# Patient Record
Sex: Female | Born: 1975 | Race: White | Hispanic: No | Marital: Married | Smoking: Former smoker
Health system: Southern US, Community
[De-identification: ages and names within clinical notes are randomized; demographics above are authoritative.]

## PROBLEM LIST (undated history)

## (undated) DIAGNOSIS — D229 Melanocytic nevi, unspecified: Secondary | ICD-10-CM

## (undated) DIAGNOSIS — E079 Disorder of thyroid, unspecified: Secondary | ICD-10-CM

## (undated) DIAGNOSIS — E039 Hypothyroidism, unspecified: Secondary | ICD-10-CM

## (undated) DIAGNOSIS — N83209 Unspecified ovarian cyst, unspecified side: Secondary | ICD-10-CM

## (undated) HISTORY — PX: OTHER SURGICAL HISTORY: SHX169

---
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
## 2001-06-14 ENCOUNTER — Encounter: Payer: Self-pay | Admitting: Family Medicine

## 2001-06-14 ENCOUNTER — Ambulatory Visit (HOSPITAL_COMMUNITY): Admission: RE | Admit: 2001-06-14 | Discharge: 2001-06-14 | Payer: Self-pay

## 2001-06-14 ENCOUNTER — Other Ambulatory Visit: Admission: RE | Admit: 2001-06-14 | Discharge: 2001-06-14 | Payer: Self-pay | Admitting: Family Medicine

## 2014-12-24 ENCOUNTER — Other Ambulatory Visit (HOSPITAL_COMMUNITY)
Admission: RE | Admit: 2014-12-24 | Discharge: 2014-12-24 | Disposition: A | Discharge status: Home / Self Care | Payer: BC Managed Care – PPO | Source: Ambulatory Visit | Attending: Obstetrics & Gynecology | Admitting: Obstetrics & Gynecology

## 2014-12-24 ENCOUNTER — Encounter: Payer: Self-pay | Admitting: Obstetrics & Gynecology

## 2014-12-24 ENCOUNTER — Ambulatory Visit (INDEPENDENT_AMBULATORY_CARE_PROVIDER_SITE_OTHER): Payer: BC Managed Care – PPO | Admitting: Obstetrics & Gynecology

## 2014-12-24 VITALS — BP 120/80 | HR 76 | Ht 63.2 in | Wt 194.0 lb

## 2014-12-24 DIAGNOSIS — Z1151 Encounter for screening for human papillomavirus (HPV): Secondary | ICD-10-CM | POA: Diagnosis present

## 2014-12-24 DIAGNOSIS — Z01419 Encounter for gynecological examination (general) (routine) without abnormal findings: Secondary | ICD-10-CM | POA: Diagnosis not present

## 2014-12-24 NOTE — Progress Notes (Signed)
Patient ID: Ashley [name], female   DOB: [birth year], 39 y.o.   MRN: [id number] Subjective:     Ashley [name] is a 39 y.o. female here for a routine exam.  Patient's last menstrual period was 12/06/2014. No obstetric history on file. Birth Control Method:  None, husband had a vasectomy Menstrual Calendar(currently): regular  Current complaints: none.   Current acute medical issues:  none   Recent Gynecologic History Patient's last menstrual period was 12/06/2014. Last Pap: 10 years ago,  normal Last mammogram: ,    History reviewed. No pertinent past medical history.  History reviewed. No pertinent past surgical history.  OB History    No data available      History   Social History  . Marital Status: Single    Spouse Name: N/A  . Number of Children: N/A  . Years of Education: N/A   Social History Main Topics  . Smoking status: Never Smoker   . Smokeless tobacco: Not on file  . Alcohol Use: Not on file  . Drug Use: Not on file  . Sexual Activity: Yes   Other Topics Concern  . None   Social History Narrative  . None    Family History  Problem Relation Age of Onset  . Diabetes Maternal Grandfather   . Heart disease Maternal Grandfather   . Hypertension Mother   . Diabetes Maternal Uncle     No current outpatient prescriptions on file.  Review of Systems  Review of Systems  Constitutional: Negative for fever, chills, weight loss, malaise/fatigue and diaphoresis.  HENT: Negative for hearing loss, ear pain, nosebleeds, congestion, sore throat, neck pain, tinnitus and ear discharge.   Eyes: Negative for blurred vision, double vision, photophobia, pain, discharge and redness.  Respiratory: Negative for cough, hemoptysis, sputum production, shortness of breath, wheezing and stridor.   Cardiovascular: Negative for chest pain, palpitations, orthopnea, claudication, leg swelling and PND.  Gastrointestinal: negative for abdominal pain. Negative for heartburn, nausea,  vomiting, diarrhea, constipation, blood in stool and melena.  Genitourinary: Negative for dysuria, urgency, frequency, hematuria and flank pain.  Musculoskeletal: Negative for myalgias, back pain, joint pain and falls.  Skin: Negative for itching and rash.  Neurological: Negative for dizziness, tingling, tremors, sensory change, speech change, focal weakness, seizures, loss of consciousness, weakness and headaches.  Endo/Heme/Allergies: Negative for environmental allergies and polydipsia. Does not bruise/bleed easily.  Psychiatric/Behavioral: Negative for depression, suicidal ideas, hallucinations, memory loss and substance abuse. The patient is not nervous/anxious and does not have insomnia.        Objective:  Blood pressure 120/80, pulse 76, height 5' 3.2" (1.605 m), weight 194 lb (87.998 kg), last menstrual period 12/06/2014.   Physical Exam  Vitals reviewed. Constitutional: She is oriented to person, place, and time. She appears well-developed and well-nourished.  HENT:  Head: Normocephalic and atraumatic.        Right Ear: External ear normal.  Left Ear: External ear normal.  Nose: Nose normal.  Mouth/Throat: Oropharynx is clear and moist.  Eyes: Conjunctivae and EOM are normal. Pupils are equal, round, and reactive to light. Right eye exhibits no discharge. Left eye exhibits no discharge. No scleral icterus.  Neck: Normal range of motion. Neck supple. No tracheal deviation present. No thyromegaly present.  Cardiovascular: Normal rate, regular rhythm, normal heart sounds and intact distal pulses.  Exam reveals no gallop and no friction rub.   No murmur heard. Respiratory: Effort normal and breath sounds normal. No respiratory distress. She has no  wheezes. She has no rales. She exhibits no tenderness.  GI: Soft. Bowel sounds are normal. She exhibits no distension and no mass. There is no tenderness. There is no rebound and no guarding.  Genitourinary:  Breasts no masses skin changes  or nipple changes bilaterally      Vulva is normal without lesions Vagina is pink moist without discharge Cervix normal in appearance and pap is done Uterus is normal size shape and contour Adnexa is negative with normal sized ovaries  {Rectal    hemoccult negative, normal tone, no masses  Musculoskeletal: Normal range of motion. She exhibits no edema and no tenderness.  Neurological: She is alert and oriented to person, place, and time. She has normal reflexes. She displays normal reflexes. No cranial nerve deficit. She exhibits normal muscle tone. Coordination normal.  Skin: Skin is warm and dry. No rash noted. No erythema. No pallor.  Psychiatric: She has a normal mood and affect. Her behavior is normal. Judgment and thought content normal.       Assessment:    Healthy female exam.    Plan:    Contraception: vasectomy. Follow up in: 1 year.

## 2014-12-24 NOTE — Addendum Note (Signed)
Addended by: Doyne Keel on: 12/24/2014 03:52 PM   Modules accepted: Orders

## 2014-12-28 LAB — CYTOLOGY - PAP

## 2015-02-05 ENCOUNTER — Telehealth: Payer: Self-pay | Admitting: Family Medicine

## 2015-02-05 NOTE — Telephone Encounter (Signed)
Advised patient that our first new patient appointment isn't until the beginning of August.  She needs to have her blood pressure rechecked and a form filled out for work since it was high on her pre-employment physical. She is going to see if someone else can get her in sooner.

## 2016-05-15 ENCOUNTER — Emergency Department (HOSPITAL_COMMUNITY): Payer: BC Managed Care – PPO

## 2016-05-15 ENCOUNTER — Emergency Department (HOSPITAL_COMMUNITY)
Admission: EM | Admit: 2016-05-15 | Discharge: 2016-05-15 | Disposition: A | Discharge status: Home / Self Care | Payer: BC Managed Care – PPO | Attending: Dermatology | Admitting: Dermatology

## 2016-05-15 ENCOUNTER — Encounter (HOSPITAL_COMMUNITY): Payer: Self-pay | Admitting: Emergency Medicine

## 2016-05-15 DIAGNOSIS — Z5321 Procedure and treatment not carried out due to patient leaving prior to being seen by health care provider: Secondary | ICD-10-CM | POA: Insufficient documentation

## 2016-05-15 DIAGNOSIS — R079 Chest pain, unspecified: Secondary | ICD-10-CM | POA: Insufficient documentation

## 2016-05-15 HISTORY — DX: Disorder of thyroid, unspecified: E07.9

## 2016-05-15 NOTE — ED Notes (Signed)
EKG given to Dr. McManus.  

## 2016-05-15 NOTE — ED Triage Notes (Signed)
Pt reports pain in center of chest radiating to abdomen x1 hour.  Pt also having sob, lightheadedness, back pain, diaphoresis.  Pt alert and oriented at this time.

## 2016-06-19 ENCOUNTER — Emergency Department (HOSPITAL_COMMUNITY)
Admission: EM | Admit: 2016-06-19 | Discharge: 2016-06-19 | Disposition: A | Discharge status: Home / Self Care | Payer: BC Managed Care – PPO | Attending: Emergency Medicine | Admitting: Emergency Medicine

## 2016-06-19 ENCOUNTER — Encounter (HOSPITAL_COMMUNITY): Payer: Self-pay | Admitting: Emergency Medicine

## 2016-06-19 DIAGNOSIS — K625 Hemorrhage of anus and rectum: Secondary | ICD-10-CM

## 2016-06-19 DIAGNOSIS — Z79899 Other long term (current) drug therapy: Secondary | ICD-10-CM | POA: Insufficient documentation

## 2016-06-19 DIAGNOSIS — K649 Unspecified hemorrhoids: Secondary | ICD-10-CM | POA: Diagnosis not present

## 2016-06-19 LAB — COMPREHENSIVE METABOLIC PANEL
ALK PHOS: 56 U/L (ref 38–126)
ALT: 15 U/L (ref 14–54)
AST: 18 U/L (ref 15–41)
Albumin: 4.3 g/dL (ref 3.5–5.0)
Anion gap: 7 (ref 5–15)
BILIRUBIN TOTAL: 0.7 mg/dL (ref 0.3–1.2)
BUN: 11 mg/dL (ref 6–20)
CALCIUM: 9.3 mg/dL (ref 8.9–10.3)
CO2: 25 mmol/L (ref 22–32)
CREATININE: 0.75 mg/dL (ref 0.44–1.00)
Chloride: 108 mmol/L (ref 101–111)
Glucose, Bld: 102 mg/dL — ABNORMAL HIGH (ref 65–99)
Potassium: 3.9 mmol/L (ref 3.5–5.1)
Sodium: 140 mmol/L (ref 135–145)
TOTAL PROTEIN: 7.3 g/dL (ref 6.5–8.1)

## 2016-06-19 LAB — CBC
HCT: 36 % (ref 36.0–46.0)
Hemoglobin: 12.9 g/dL (ref 12.0–15.0)
MCH: 31.8 pg (ref 26.0–34.0)
MCHC: 35.8 g/dL (ref 30.0–36.0)
MCV: 88.7 fL (ref 78.0–100.0)
PLATELETS: 224 10*3/uL (ref 150–400)
RBC: 4.06 MIL/uL (ref 3.87–5.11)
RDW: 12.2 % (ref 11.5–15.5)
WBC: 5.2 10*3/uL (ref 4.0–10.5)

## 2016-06-19 LAB — POC OCCULT BLOOD, ED: FECAL OCCULT BLD: POSITIVE — AB

## 2016-06-19 MED ORDER — PRAMOXINE HCL 1 % RE FOAM: 1.0000 "application " | Freq: Three times a day (TID) | RECTAL | 0 refills | Status: DC | PRN

## 2016-06-19 MED ORDER — DOCUSATE SODIUM 100 MG PO CAPS: 100.0000 mg | ORAL_CAPSULE | Freq: Two times a day (BID) | ORAL | 0 refills | Status: DC

## 2016-06-19 NOTE — Discharge Instructions (Signed)
Follow-up with a primary care doctor later this week to be rechecked,  use the stool softeners and  the rectal cream, return to the emergency room for worsening symptoms, lightheadedness, fevers

## 2016-06-19 NOTE — ED Provider Notes (Signed)
[facility] DEPT Provider Note   CSN: [id number] Arrival date & time: 06/19/16  1201  By signing my name below, I, [name], attest that this documentation has been prepared under the direction and in the presence of Dorie Rank, MD . Electronically Signed: [name], Scribe. 06/19/2016. 12:46 PM.  History   Chief Complaint Chief Complaint  Patient presents with  . Rectal Bleeding   The history is provided by the patient. No language interpreter was used.   HPI Comments: Ashley [name] is a 40 y.o. female who presents to the Emergency Department for an evaluation of rectal bleeding that began at 11:00 AM this morning. Pt reports she was at work this morning when she used the restroom, passed some stool and saw a large amount of bright red blood after wiping. She denies dizziness, lightheadedness, abdominal pain, rectal pain, fever, chills and hx of hemorrhoids or rectal bleeding.   Past Medical History:  Diagnosis Date  . Thyroid disease    There are no active problems to display for this patient.  History reviewed. No pertinent surgical history.  OB History    Gravida Para Term Preterm AB Living             0   SAB TAB Ectopic Multiple Live Births                 Home Medications    Prior to Admission medications   Medication Sig Start Date End Date Taking? Authorizing Provider  acetaminophen (TYLENOL) 500 MG tablet Take 1,000 mg by mouth every 6 (six) hours as needed.   Yes Historical Provider, MD  levothyroxine (SYNTHROID, LEVOTHROID) 25 MCG tablet Take 1 tablet by mouth every evening. 06/02/16  Yes Historical Provider, MD  docusate sodium (COLACE) 100 MG capsule Take 1 capsule (100 mg total) by mouth every 12 (twelve) hours. 06/19/16   Dorie Rank, MD  pramoxine (PROCTOFOAM) 1 % foam Place 1 application rectally 3 (three) times daily as needed for itching. 06/19/16   Dorie Rank, MD    Family History Family History  Problem Relation Age of Onset  . Diabetes Maternal  Grandfather   . Heart disease Maternal Grandfather   . Hypertension Mother   . Diabetes Maternal Uncle     Social History Social History  Substance Use Topics  . Smoking status: Never Smoker  . Smokeless tobacco: Never Used  . Alcohol use No   Allergies   Patient has no known allergies.  Review of Systems Review of Systems  Constitutional: Negative for chills and fever.  Gastrointestinal: Positive for anal bleeding. Negative for abdominal pain and rectal pain.  Neurological: Negative for dizziness and light-headedness.   Physical Exam Updated Vital Signs BP 155/83 (BP Location: Left Arm)   Pulse 99   Temp 98.2 F (36.8 C) (Oral)   Resp 20   Ht 5\' 3"  (1.6 m)   Wt 190 lb (86.2 kg)   LMP 06/16/2016   SpO2 100%   BMI 33.66 kg/m   Physical Exam  Constitutional: She appears well-developed and well-nourished. No distress.  HENT:  Head: Normocephalic and atraumatic.  Right Ear: External ear normal.  Left Ear: External ear normal.  Eyes: Conjunctivae are normal. Right eye exhibits no discharge. Left eye exhibits no discharge. No scleral icterus.  Neck: Neck supple. No tracheal deviation present.  Cardiovascular: Normal rate, regular rhythm and intact distal pulses.   Pulmonary/Chest: Effort normal and breath sounds normal. No stridor. No respiratory distress. She has no  wheezes. She has no rales.  Abdominal: Soft. Bowel sounds are normal. She exhibits no distension. There is no tenderness. There is no rebound and no guarding.  Genitourinary: Rectal exam shows external hemorrhoid, tenderness and guaiac positive stool.  Genitourinary Comments: Bright red blood on rectal exam, no internal masses palpated.  Musculoskeletal: She exhibits no edema or tenderness.  Neurological: She is alert. She has normal strength. No cranial nerve deficit (no facial droop, extraocular movements intact, no slurred speech) or sensory deficit. She exhibits normal muscle tone. She displays no seizure  activity. Coordination normal.  Skin: Skin is warm and dry. No rash noted.  Psychiatric: She has a normal mood and affect.  Nursing note and vitals reviewed.  ED Treatments / Results  Labs (all labs ordered are listed, but only abnormal results are displayed) Labs Reviewed  COMPREHENSIVE METABOLIC PANEL - Abnormal; Notable for the following:       Result Value   Glucose, Bld 102 (*)    All other components within normal limits  POC OCCULT BLOOD, ED - Abnormal; Notable for the following:    Fecal Occult Bld POSITIVE (*)    All other components within normal limits  CBC   Procedures Procedures (including critical care time)    Oxygen Saturation is 100% on RA, normal by my interpretation.    COORDINATION OF CARE:  12:43 PM Discussed treatment plan with pt at bedside and pt agreed to plan.  Initial Impression / Assessment and Plan / ED Course  I have reviewed the triage vital signs and the nursing notes.  Pertinent labs & imaging results that were available during my care of the patient were reviewed by me and considered in my medical decision making (see chart for details).  Clinical Course     The patient's laboratory tests are reassuring. On exam she does have a small tender hemorrhoid. I suspect this is the source of her rectal bleeding. Plan on discharge home with stool softeners and hemorrhoid cream. Follow up with primary doctor. Warning signs and precautions discussed.  I personally performed the services described in this documentation, which was scribed in my presence. The recorded information has been reviewed and is accurate.   Final Clinical Impressions(s) / ED Diagnoses   Final diagnoses:  Hemorrhoids, unspecified hemorrhoid type  Rectal bleeding    New Prescriptions New Prescriptions   DOCUSATE SODIUM (COLACE) 100 MG CAPSULE    Take 1 capsule (100 mg total) by mouth every 12 (twelve) hours.   PRAMOXINE (PROCTOFOAM) 1 % FOAM    Place 1 application rectally  3 (three) times daily as needed for itching.     Dorie Rank, MD 06/19/16 [phone]

## 2016-06-19 NOTE — ED Triage Notes (Signed)
Pt reports passing bright red blood this am. No prior hx of same. Pt denies hemorrhoids.

## 2017-04-10 LAB — OB RESULTS CONSOLE GC/CHLAMYDIA
CHLAMYDIA, DNA PROBE: NEGATIVE
GC PROBE AMP, GENITAL: NEGATIVE

## 2017-04-10 LAB — OB RESULTS CONSOLE ABO/RH: RH Type: POSITIVE

## 2017-04-10 LAB — OB RESULTS CONSOLE ANTIBODY SCREEN: ANTIBODY SCREEN: NEGATIVE

## 2017-04-10 LAB — OB RESULTS CONSOLE RUBELLA ANTIBODY, IGM: RUBELLA: IMMUNE

## 2017-04-10 LAB — OB RESULTS CONSOLE HIV ANTIBODY (ROUTINE TESTING): HIV: NONREACTIVE

## 2017-04-10 LAB — OB RESULTS CONSOLE RPR: RPR: NONREACTIVE

## 2017-04-10 LAB — OB RESULTS CONSOLE HEPATITIS B SURFACE ANTIGEN: HEP B S AG: NEGATIVE

## 2017-08-07 NOTE — L&D Delivery Note (Addendum)
Patient was C/C/+2 and pushed for approx 3hr 25minutes with epidural.    NSVD  female infant OP presentation with some asynclitism, Apgars per nursery staff , weight pending.   The patient had a 2nd degree R mediolateral episiotomy repaired with 2-0 vicryl Fundus was firm. EBL was approx 500, 1024mcg cytotec placed rectally and once dose of hemabate 0.25mg  IM, clots manually evacuated from uterus, bleeding reduced significantly Placenta was delivered intact. Vagina was clear.  Delayed cord clamping done for 30-60 seconds while warming baby. Baby was vigorous and doing skin to skin with mother.  Ashley [name]

## 2017-08-08 ENCOUNTER — Encounter: Payer: BC Managed Care – PPO | Attending: Obstetrics and Gynecology | Admitting: Registered"

## 2017-08-08 ENCOUNTER — Encounter: Payer: Self-pay | Admitting: Registered"

## 2017-08-08 DIAGNOSIS — O24419 Gestational diabetes mellitus in pregnancy, unspecified control: Secondary | ICD-10-CM | POA: Diagnosis not present

## 2017-08-08 DIAGNOSIS — Z713 Dietary counseling and surveillance: Secondary | ICD-10-CM | POA: Insufficient documentation

## 2017-08-08 DIAGNOSIS — R7309 Other abnormal glucose: Secondary | ICD-10-CM

## 2017-08-08 NOTE — Progress Notes (Signed)
Patient was seen on 08/08/17 for Gestational Diabetes self-management class at the [facility]. The following learning objectives were met by the patient during this course:   States the definition of Gestational Diabetes  States why dietary management is important in controlling blood glucose  Describes the effects each nutrient has on blood glucose levels  Demonstrates ability to create a balanced meal plan  Demonstrates carbohydrate counting   States when to check blood glucose levels  Demonstrates proper blood glucose monitoring techniques  States the effect of stress and exercise on blood glucose levels  States the importance of limiting caffeine and abstaining from alcohol and smoking  Blood glucose monitor given: none Lot # n/a Exp: n/a Blood glucose reading: n/a  Patient instructed to monitor glucose levels: FBS: 60 - <95 1 hour: <140 2 hour: <120  Patient received handouts:  Nutrition Diabetes and Pregnancy  Carbohydrate Counting List  Patient will be seen for follow-up as needed.

## 2017-08-16 ENCOUNTER — Ambulatory Visit (INDEPENDENT_AMBULATORY_CARE_PROVIDER_SITE_OTHER): Payer: BC Managed Care – PPO | Admitting: Pediatrics

## 2017-08-16 DIAGNOSIS — Z7681 Expectant parent(s) prebirth pediatrician visit: Secondary | ICD-10-CM

## 2017-08-17 ENCOUNTER — Encounter: Payer: Self-pay | Admitting: Pediatrics

## 2017-08-17 NOTE — Progress Notes (Signed)
Prenatal counseling for impending newborn done-- Z76.81  

## 2017-09-11 LAB — OB RESULTS CONSOLE GBS: GBS: NEGATIVE

## 2017-09-24 ENCOUNTER — Inpatient Hospital Stay (HOSPITAL_COMMUNITY)
Admission: RE | Admit: 2017-09-24 | Discharge: 2017-09-27 | DRG: 807 | Disposition: A | Discharge status: Home / Self Care | Payer: BC Managed Care – PPO | Source: Ambulatory Visit | Attending: Obstetrics and Gynecology | Admitting: Obstetrics and Gynecology

## 2017-09-24 ENCOUNTER — Encounter (HOSPITAL_COMMUNITY): Payer: Self-pay | Admitting: Certified Registered Nurse Anesthetist

## 2017-09-24 ENCOUNTER — Encounter (HOSPITAL_COMMUNITY): Payer: Self-pay | Admitting: General Practice

## 2017-09-24 ENCOUNTER — Other Ambulatory Visit: Payer: Self-pay

## 2017-09-24 ENCOUNTER — Inpatient Hospital Stay (HOSPITAL_COMMUNITY): Payer: BC Managed Care – PPO | Admitting: Anesthesiology

## 2017-09-24 DIAGNOSIS — O99214 Obesity complicating childbirth: Secondary | ICD-10-CM | POA: Diagnosis present

## 2017-09-24 DIAGNOSIS — O3413 Maternal care for benign tumor of corpus uteri, third trimester: Secondary | ICD-10-CM | POA: Diagnosis present

## 2017-09-24 DIAGNOSIS — O134 Gestational [pregnancy-induced] hypertension without significant proteinuria, complicating childbirth: Principal | ICD-10-CM | POA: Diagnosis present

## 2017-09-24 DIAGNOSIS — O24425 Gestational diabetes mellitus in childbirth, controlled by oral hypoglycemic drugs: Secondary | ICD-10-CM | POA: Diagnosis present

## 2017-09-24 DIAGNOSIS — E039 Hypothyroidism, unspecified: Secondary | ICD-10-CM | POA: Diagnosis present

## 2017-09-24 DIAGNOSIS — Z3A37 37 weeks gestation of pregnancy: Secondary | ICD-10-CM

## 2017-09-24 DIAGNOSIS — O99284 Endocrine, nutritional and metabolic diseases complicating childbirth: Secondary | ICD-10-CM | POA: Diagnosis present

## 2017-09-24 DIAGNOSIS — Z3490 Encounter for supervision of normal pregnancy, unspecified, unspecified trimester: Secondary | ICD-10-CM

## 2017-09-24 DIAGNOSIS — D259 Leiomyoma of uterus, unspecified: Secondary | ICD-10-CM | POA: Diagnosis present

## 2017-09-24 LAB — COMPREHENSIVE METABOLIC PANEL
ALBUMIN: 2.8 g/dL — AB (ref 3.5–5.0)
ALK PHOS: 131 U/L — AB (ref 38–126)
ALT: 11 U/L — ABNORMAL LOW (ref 14–54)
ANION GAP: 9 (ref 5–15)
AST: 21 U/L (ref 15–41)
BUN: 15 mg/dL (ref 6–20)
CALCIUM: 8.3 mg/dL — AB (ref 8.9–10.3)
CO2: 18 mmol/L — AB (ref 22–32)
Chloride: 107 mmol/L (ref 101–111)
Creatinine, Ser: 0.69 mg/dL (ref 0.44–1.00)
GFR calc Af Amer: >60 mL/min (ref >60)
GFR calc non Af Amer: >60 mL/min (ref >60)
GLUCOSE: 80 mg/dL (ref 65–99)
POTASSIUM: 4.2 mmol/L (ref 3.5–5.1)
SODIUM: 134 mmol/L — AB (ref 135–145)
Total Bilirubin: 1 mg/dL (ref 0.3–1.2)
Total Protein: 6.2 g/dL — ABNORMAL LOW (ref 6.5–8.1)

## 2017-09-24 LAB — CBC
HCT: 31.8 % — ABNORMAL LOW (ref 36.0–46.0)
HEMATOCRIT: 29.1 % — AB (ref 36.0–46.0)
HEMOGLOBIN: 10.2 g/dL — AB (ref 12.0–15.0)
HEMOGLOBIN: 11 g/dL — AB (ref 12.0–15.0)
MCH: 30.2 pg (ref 26.0–34.0)
MCH: 30.3 pg (ref 26.0–34.0)
MCHC: 35.1 g/dL (ref 30.0–36.0)
MCHC: 34.6 g/dL (ref 30.0–36.0)
MCV: 86.1 fL (ref 78.0–100.0)
MCV: 87.6 fL (ref 78.0–100.0)
Platelets: 206 10*3/uL (ref 150–400)
Platelets: 181 10*3/uL (ref 150–400)
RBC: 3.38 MIL/uL — AB (ref 3.87–5.11)
RBC: 3.63 MIL/uL — AB (ref 3.87–5.11)
RDW: 13.7 % (ref 11.5–15.5)
RDW: 13.6 % (ref 11.5–15.5)
WBC: 6.4 10*3/uL (ref 4.0–10.5)
WBC: 8.6 10*3/uL (ref 4.0–10.5)

## 2017-09-24 LAB — TYPE AND SCREEN
ABO/RH(D): O POS
ANTIBODY SCREEN: NEGATIVE

## 2017-09-24 LAB — GLUCOSE, CAPILLARY
GLUCOSE-CAPILLARY: 84 mg/dL (ref 65–99)
GLUCOSE-CAPILLARY: 75 mg/dL (ref 65–99)
GLUCOSE-CAPILLARY: 92 mg/dL (ref 65–99)
Glucose-Capillary: 94 mg/dL (ref 65–99)
Glucose-Capillary: 74 mg/dL (ref 65–99)
Glucose-Capillary: 68 mg/dL (ref 65–99)

## 2017-09-24 LAB — ABO/RH: ABO/RH(D): O POS

## 2017-09-24 LAB — RPR: RPR: NONREACTIVE

## 2017-09-24 MED ORDER — OXYCODONE-ACETAMINOPHEN 5-325 MG PO TABS: 2.0000 | ORAL_TABLET | ORAL | Status: DC | PRN

## 2017-09-24 MED ORDER — OXYTOCIN BOLUS FROM INFUSION
500.0000 mL | Freq: Once | INTRAVENOUS | Status: AC
  Administered 2017-09-25: 500 mL via INTRAVENOUS

## 2017-09-24 MED ORDER — MISOPROSTOL 25 MCG QUARTER TABLET
25.0000 ug | ORAL_TABLET | ORAL | Status: DC | PRN
  Administered 2017-09-24 (×2): 25 ug via VAGINAL
  Filled 2017-09-24 (×4): qty 1

## 2017-09-24 MED ORDER — EPHEDRINE 5 MG/ML INJ
10.0000 mg | INTRAVENOUS | Status: DC | PRN
  Filled 2017-09-24: qty 2

## 2017-09-24 MED ORDER — LIDOCAINE HCL (PF) 1 % IJ SOLN
INTRAMUSCULAR | Status: DC | PRN
  Administered 2017-09-24 (×2): 5 mL via EPIDURAL

## 2017-09-24 MED ORDER — SOD CITRATE-CITRIC ACID 500-334 MG/5ML PO SOLN: 30.0000 mL | ORAL | Status: DC | PRN

## 2017-09-24 MED ORDER — ONDANSETRON HCL 4 MG/2ML IJ SOLN
4.0000 mg | Freq: Four times a day (QID) | INTRAMUSCULAR | Status: DC | PRN
  Administered 2017-09-24: 4 mg via INTRAVENOUS
  Filled 2017-09-24: qty 2

## 2017-09-24 MED ORDER — OXYTOCIN 40 UNITS IN LACTATED RINGERS INFUSION - SIMPLE MED
1.0000 m[IU]/min | INTRAVENOUS | Status: DC
  Administered 2017-09-24: 2 m[IU]/min via INTRAVENOUS

## 2017-09-24 MED ORDER — PHENYLEPHRINE 40 MCG/ML (10ML) SYRINGE FOR IV PUSH (FOR BLOOD PRESSURE SUPPORT)
80.0000 ug | PREFILLED_SYRINGE | INTRAVENOUS | Status: DC | PRN
  Filled 2017-09-24: qty 10
  Filled 2017-09-24: qty 5

## 2017-09-24 MED ORDER — BUTORPHANOL TARTRATE 1 MG/ML IJ SOLN
1.0000 mg | INTRAMUSCULAR | Status: DC | PRN
  Administered 2017-09-24 (×2): 1 mg via INTRAVENOUS
  Filled 2017-09-24 (×2): qty 1

## 2017-09-24 MED ORDER — FLEET ENEMA 7-19 GM/118ML RE ENEM: 1.0000 | ENEMA | Freq: Once | RECTAL | Status: DC

## 2017-09-24 MED ORDER — LACTATED RINGERS IV SOLN: 500.0000 mL | Freq: Once | INTRAVENOUS | Status: DC

## 2017-09-24 MED ORDER — TERBUTALINE SULFATE 1 MG/ML IJ SOLN
0.2500 mg | Freq: Once | INTRAMUSCULAR | Status: DC | PRN
  Filled 2017-09-24: qty 1

## 2017-09-24 MED ORDER — PHENYLEPHRINE 40 MCG/ML (10ML) SYRINGE FOR IV PUSH (FOR BLOOD PRESSURE SUPPORT)
80.0000 ug | PREFILLED_SYRINGE | INTRAVENOUS | Status: DC | PRN
  Filled 2017-09-24: qty 5

## 2017-09-24 MED ORDER — LEVOTHYROXINE SODIUM 150 MCG PO TABS
150.0000 ug | ORAL_TABLET | Freq: Every day | ORAL | Status: DC
  Administered 2017-09-24 – 2017-09-25 (×2): 150 ug via ORAL
  Filled 2017-09-24 (×3): qty 1

## 2017-09-24 MED ORDER — OXYTOCIN 40 UNITS IN LACTATED RINGERS INFUSION - SIMPLE MED
2.5000 [IU]/h | INTRAVENOUS | Status: DC
  Filled 2017-09-24: qty 1000

## 2017-09-24 MED ORDER — LIDOCAINE HCL (PF) 1 % IJ SOLN
30.0000 mL | INTRAMUSCULAR | Status: DC | PRN
  Administered 2017-09-25: 30 mL via SUBCUTANEOUS
  Filled 2017-09-24: qty 30

## 2017-09-24 MED ORDER — FENTANYL 2.5 MCG/ML BUPIVACAINE 1/10 % EPIDURAL INFUSION (WH - ANES)
14.0000 mL/h | INTRAMUSCULAR | Status: DC | PRN
  Administered 2017-09-24 – 2017-09-25 (×3): 14 mL/h via EPIDURAL
  Filled 2017-09-24 (×3): qty 100

## 2017-09-24 MED ORDER — DIPHENHYDRAMINE HCL 50 MG/ML IJ SOLN: 12.5000 mg | INTRAMUSCULAR | Status: DC | PRN

## 2017-09-24 MED ORDER — ACETAMINOPHEN 325 MG PO TABS
650.0000 mg | ORAL_TABLET | ORAL | Status: DC | PRN
  Administered 2017-09-24: 650 mg via ORAL
  Filled 2017-09-24: qty 2

## 2017-09-24 MED ORDER — OXYCODONE-ACETAMINOPHEN 5-325 MG PO TABS: 1.0000 | ORAL_TABLET | ORAL | Status: DC | PRN

## 2017-09-24 MED ORDER — LACTATED RINGERS IV SOLN
INTRAVENOUS | Status: DC
  Administered 2017-09-24 – 2017-09-25 (×6): via INTRAVENOUS

## 2017-09-24 MED ORDER — LACTATED RINGERS IV SOLN: 500.0000 mL | INTRAVENOUS | Status: DC | PRN

## 2017-09-24 NOTE — Progress Notes (Signed)
Pt with mild cramping  BP (!) 151/89   Pulse 85   Temp 98.3 F (36.8 C) (Oral)   Resp 18   Ht 5\' 3"  (1.6 m)   Wt 103.1 kg (227 lb 6.4 oz)   Breastfeeding? Unknown   BMI 40.28 kg/m   Toco: q2-3 min EFM: Cat 1  SVE: 1/50/-3 (vtx confirmed by bsus).  FB placed--as it was being filled w 60cc of NS, SROM occurred, clear fluid  A&P: G2P0010 @ [redacted]w[redacted]d w IOL for ghtn ghtn--bps mild range FB placed--pt contracting regularly--will start pitocin if ctx space Fsr/vtx/gbs neg

## 2017-09-24 NOTE — H&P (Signed)
42 y.o. G1P0 @ [redacted]w[redacted]d presents with IOL for gestational hypertension.  Otherwise has good fetal movement and no bleeding.  Pregnancy c/b: 1. Hypothyroidism: on synthroid 150ug 2. AMA: declined genetics 3. IVF 4. Fibroids: Anterior left lateral 7.6 cm, posterior left lateral 4.8 cm, posterior 2.5 cm 5. 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 GHTN: diagnosed at 41 weeks, not on any anti-hypertensive medications   2/12: EFW 3013g (6lb 10oz) 60%, posterior placenta  Past Medical History:  Diagnosis Date  . Thyroid disease    History reviewed. No pertinent surgical history.  OB History  Gravida Para Term Preterm AB Living  2       1 0  SAB TAB Ectopic Multiple Live Births  1            # Outcome Date GA Lbr Len/2nd Weight Sex Delivery Anes PTL Lv  2 Current           1 SAB 10/04/16              Social History   Socioeconomic History  . Marital status: Married    Spouse name: Not on file  . Number of children: Not on file  . Years of education: Not on file  . Highest education level: Not on file  Tobacco Use  . Smoking status: Never Smoker  . Smokeless tobacco: Never Used  Substance and Sexual Activity  . Alcohol use: No    Alcohol/week: 0.0 oz  . Drug use: No  . Sexual activity: Yes    Birth control/protection: None  Other Topics Concern  . Not on file  Social History Narrative  . Not on file   Patient has no known allergies.    Prenatal Transfer Tool  Maternal Diabetes: Yes:  Diabetes Type:  Insulin/Medication controlled Genetic Screening: Declined Maternal Ultrasounds/Referrals: Normal Fetal Ultrasounds or other Referrals:  None Maternal Substance Abuse:  No Significant Maternal Medications:  Meds include: Syntroid Other:   Metformin Significant Maternal Lab Results: Lab values include: Group B Strep negative  ABO, Rh: --/--/O POS, O POS Performed at [facility], [address], [place], [place] [address]  [phone]) Antibody: NEG (02/18 0254) Rubella:  Immune (09/04 0000) RPR: Nonreactive (09/04 0000)  HBsAg: Negative (09/04 0000)  HIV: Non-reactive (09/04 0000)  GBS: Negative (02/05 0000)     Other PNC: uncomplicated.    Vitals:   09/24/17 0915 09/24/17 1030  BP: (!) 144/85 (!) 145/85  Pulse: 80 80  Resp: 16 18  Temp:       General:  NAD Abdomen:  soft, gravid, EFW 7# Ex:  1+ edema b/l SVE:  Closed/long/high per RN FHTs:  140s, mod var, + accels, no decels Toco:  q2-3 min   A/P   42 y.o. G2P0010 @ [redacted]w[redacted]d presents for IOL for gestational hypertension GHTN--BPs mild range, check CMP now Hypothyroid--cont syntrhoid 150ug GMDA2--serial BG, insulin gtt prn IOL--cervix unfavorable, continue cytotec for cervical ripening  FSR/ vtx/ GBS neg  [place]

## 2017-09-24 NOTE — Anesthesia Procedure Notes (Signed)
Epidural Patient location during procedure: OB Start time: 09/24/2017 3:41 PM End time: 09/24/2017 2:56 PM  Staffing Anesthesiologist: Duane Boston, MD Performed: anesthesiologist   Preanesthetic Checklist Completed: patient identified, site marked, pre-op evaluation, timeout performed, IV checked, risks and benefits discussed and monitors and equipment checked  Epidural Patient position: sitting Prep: DuraPrep Patient monitoring: heart rate, cardiac monitor, continuous pulse ox and blood pressure Approach: midline Location: L2-L3 Injection technique: LOR saline  Needle:  Needle type: Tuohy  Needle gauge: 17 G Needle length: 9 cm Needle insertion depth: 7 cm Catheter size: 20 Guage Catheter at skin depth: 12 cm Test dose: negative and Other  Assessment Events: blood not aspirated, injection not painful, no injection resistance and negative IV test  Additional Notes Informed consent obtained prior to proceeding including risk of failure, 1% risk of PDPH, risk of minor discomfort and bruising.  Discussed rare but serious complications including epidural abscess, permanent nerve injury, epidural hematoma.  Discussed alternatives to epidural analgesia and patient desires to proceed.  Timeout performed pre-procedure verifying patient name, procedure, and platelet count.  Patient tolerated procedure well.

## 2017-09-24 NOTE — Progress Notes (Signed)
Pt seen and examined.  Tylenol improved headache, but now with some nausea.  FB was out at 1500.  She was started on pitocin at approximately 1700 when no cervical change was noted.  She was called 8/100/0 at 2000, however by my exam at this time, she is only 5 cm.   BP 123/60   Pulse (!) 108   Temp 99.4 F (37.4 C) (Oral)   Resp 18   Ht 5\' 3"  (1.6 m)   Wt 103.1 kg (227 lb 6.4 oz)   Breastfeeding? Unknown   BMI 40.28 kg/m   Toco: q4-5 min EFM: 150s, mod var, occ variable decel SVE: 5/100/-1.  Left side of cvx 100% effaced but significant edema of right cervix  A&P: G2P0010 @ [redacted]w[redacted]d w IOL for ghtn at term IUPC placed--will cont to titrate pitocin.  Not in active labor at this time GDMA2--fsbs 70-90s ghtn--bps mild range / normal s/p epidural  Fsr/vtx/gbs neg

## 2017-09-24 NOTE — Anesthesia Preprocedure Evaluation (Signed)
Anesthesia Evaluation  Patient identified by MRN, date of birth, ID band Patient awake    Reviewed: Allergy & Precautions, NPO status , Patient's Chart, lab work & pertinent test results  Airway Mallampati: III  TM Distance: >3 FB Neck ROM: Full    Dental no notable dental hx. (+) Dental Advisory Given   Pulmonary neg pulmonary ROS,    Pulmonary exam normal        Cardiovascular hypertension, negative cardio ROS Normal cardiovascular exam     Neuro/Psych negative neurological ROS  negative psych ROS   GI/Hepatic negative GI ROS, Neg liver ROS,   Endo/Other  diabetesMorbid obesity  Renal/GU negative Renal ROS  negative genitourinary   Musculoskeletal negative musculoskeletal ROS (+)   Abdominal   Peds negative pediatric ROS (+)  Hematology negative hematology ROS (+)   Anesthesia Other Findings   Reproductive/Obstetrics (+) Pregnancy                             Anesthesia Physical Anesthesia Plan  ASA: III  Anesthesia Plan: Epidural   Post-op Pain Management:    Induction:   PONV Risk Score and Plan:   Airway Management Planned: Natural Airway  Additional Equipment:   Intra-op Plan:   Post-operative Plan:   Informed Consent: I have reviewed the patients History and Physical, chart, labs and discussed the procedure including the risks, benefits and alternatives for the proposed anesthesia with the patient or authorized representative who has indicated his/her understanding and acceptance.   Dental advisory given  Plan Discussed with: Anesthesiologist  Anesthesia Plan Comments:         Anesthesia Quick Evaluation

## 2017-09-24 NOTE — Anesthesia Pain Management Evaluation Note (Signed)
  [facility] Pain Management Visit Note  Patient: Ashley [name], 42 y.o., female  "Hello I am a member of the anesthesia team at [facility]. We have an anesthesia team available at all times to provide care throughout the hospital, including epidural management and anesthesia for C-section. I don't know your plan for the delivery whether it a natural birth, water birth, IV sedation, nitrous supplementation, doula or epidural, but we want to meet your pain goals."   1.Was your pain managed to your expectations on prior hospitalizations?   No prior hospitalizations  2.What is your expectation for pain management during this hospitalization?     Epidural  3.How can we help you reach that goal? epidural  Record the patient's initial score and the patient's pain goal.   Pain: 0  Pain Goal: 5 [facility] wants you to be able to say your pain was always managed very well.  Ninnie Fein 09/24/2017

## 2017-09-24 NOTE — Progress Notes (Signed)

## 2017-09-25 ENCOUNTER — Encounter (HOSPITAL_COMMUNITY): Payer: Self-pay

## 2017-09-25 LAB — GLUCOSE, CAPILLARY
GLUCOSE-CAPILLARY: 94 mg/dL (ref 65–99)
GLUCOSE-CAPILLARY: 87 mg/dL (ref 65–99)
GLUCOSE-CAPILLARY: 92 mg/dL (ref 65–99)
GLUCOSE-CAPILLARY: 76 mg/dL (ref 65–99)
Glucose-Capillary: 106 mg/dL — ABNORMAL HIGH (ref 65–99)
Glucose-Capillary: 106 mg/dL — ABNORMAL HIGH (ref 65–99)

## 2017-09-25 MED ORDER — SENNOSIDES-DOCUSATE SODIUM 8.6-50 MG PO TABS
2.0000 | ORAL_TABLET | ORAL | Status: DC
  Administered 2017-09-25 – 2017-09-26 (×2): 2 via ORAL
  Filled 2017-09-25 (×2): qty 2

## 2017-09-25 MED ORDER — DOCUSATE SODIUM 100 MG PO CAPS: 100.0000 mg | ORAL_CAPSULE | Freq: Every day | ORAL | Status: DC | PRN

## 2017-09-25 MED ORDER — CARBOPROST TROMETHAMINE 250 MCG/ML IM SOLN
250.0000 ug | Freq: Once | INTRAMUSCULAR | Status: AC
  Administered 2017-09-25: 250 ug via INTRAMUSCULAR

## 2017-09-25 MED ORDER — CARBOPROST TROMETHAMINE 250 MCG/ML IM SOLN
INTRAMUSCULAR | Status: AC
  Filled 2017-09-25: qty 1

## 2017-09-25 MED ORDER — MISOPROSTOL 200 MCG PO TABS
ORAL_TABLET | ORAL | Status: AC
  Administered 2017-09-25: 1000 ug
  Filled 2017-09-25: qty 5

## 2017-09-25 MED ORDER — SIMETHICONE 80 MG PO CHEW
80.0000 mg | CHEWABLE_TABLET | ORAL | Status: DC | PRN
Start: 2017-09-25 — End: 2017-09-27

## 2017-09-25 MED ORDER — ZOLPIDEM TARTRATE 5 MG PO TABS: 5.0000 mg | ORAL_TABLET | Freq: Every evening | ORAL | Status: DC | PRN

## 2017-09-25 MED ORDER — BENZOCAINE-MENTHOL 20-0.5 % EX AERO
1.0000 "application " | INHALATION_SPRAY | CUTANEOUS | Status: DC | PRN
  Administered 2017-09-25: 1 via TOPICAL
  Filled 2017-09-25: qty 56

## 2017-09-25 MED ORDER — IBUPROFEN 600 MG PO TABS
600.0000 mg | ORAL_TABLET | Freq: Four times a day (QID) | ORAL | Status: DC
  Administered 2017-09-25 – 2017-09-27 (×7): 600 mg via ORAL
  Filled 2017-09-25 (×7): qty 1

## 2017-09-25 MED ORDER — ONDANSETRON HCL 4 MG PO TABS: 4.0000 mg | ORAL_TABLET | ORAL | Status: DC | PRN

## 2017-09-25 MED ORDER — ONDANSETRON HCL 4 MG/2ML IJ SOLN: 4.0000 mg | INTRAMUSCULAR | Status: DC | PRN

## 2017-09-25 MED ORDER — ACETAMINOPHEN 325 MG PO TABS
650.0000 mg | ORAL_TABLET | ORAL | Status: DC | PRN
  Administered 2017-09-25: 650 mg via ORAL
  Filled 2017-09-25: qty 2

## 2017-09-25 MED ORDER — DIPHENOXYLATE-ATROPINE 2.5-0.025 MG PO TABS
1.0000 | ORAL_TABLET | Freq: Once | ORAL | Status: AC
  Administered 2017-09-25: 1 via ORAL
  Filled 2017-09-25: qty 1

## 2017-09-25 MED ORDER — WITCH HAZEL-GLYCERIN EX PADS: 1.0000 "application " | MEDICATED_PAD | CUTANEOUS | Status: DC | PRN

## 2017-09-25 MED ORDER — DIBUCAINE 1 % RE OINT
1.0000 "application " | TOPICAL_OINTMENT | RECTAL | Status: DC | PRN
  Administered 2017-09-26: 1 via RECTAL
  Filled 2017-09-25: qty 28

## 2017-09-25 MED ORDER — PRENATAL MULTIVITAMIN CH
1.0000 | ORAL_TABLET | Freq: Every day | ORAL | Status: DC
  Administered 2017-09-26: 1 via ORAL
  Filled 2017-09-25: qty 1

## 2017-09-25 MED ORDER — COCONUT OIL OIL: 1.0000 "application " | TOPICAL_OIL | Status: DC | PRN

## 2017-09-25 MED ORDER — LEVOTHYROXINE SODIUM 150 MCG PO TABS
150.0000 ug | ORAL_TABLET | Freq: Every day | ORAL | Status: DC
  Administered 2017-09-26 – 2017-09-27 (×2): 150 ug via ORAL
  Filled 2017-09-25 (×2): qty 1

## 2017-09-25 MED ORDER — DIPHENHYDRAMINE HCL 25 MG PO CAPS: 25.0000 mg | ORAL_CAPSULE | Freq: Four times a day (QID) | ORAL | Status: DC | PRN

## 2017-09-25 MED ORDER — OXYCODONE-ACETAMINOPHEN 5-325 MG PO TABS: 2.0000 | ORAL_TABLET | ORAL | Status: DC | PRN

## 2017-09-25 MED ORDER — OXYCODONE-ACETAMINOPHEN 5-325 MG PO TABS
1.0000 | ORAL_TABLET | ORAL | Status: DC | PRN
  Administered 2017-09-26: 1 via ORAL
  Filled 2017-09-25: qty 1

## 2017-09-25 MED ORDER — TETANUS-DIPHTH-ACELL PERTUSSIS 5-2.5-18.5 LF-MCG/0.5 IM SUSP: 0.5000 mL | Freq: Once | INTRAMUSCULAR | Status: DC

## 2017-09-25 MED ORDER — METFORMIN HCL 500 MG PO TABS
500.0000 mg | ORAL_TABLET | Freq: Every day | ORAL | Status: DC
  Administered 2017-09-25 – 2017-09-26 (×2): 500 mg via ORAL
  Filled 2017-09-25 (×2): qty 1

## 2017-09-25 NOTE — Progress Notes (Signed)
Per dr Rogue Bussing call md if blood pressure systolic 376> and diastolic /283>  will continue to monitor.

## 2017-09-26 LAB — CBC
HEMATOCRIT: 24.8 % — AB (ref 36.0–46.0)
HEMOGLOBIN: 8.6 g/dL — AB (ref 12.0–15.0)
MCH: 30.4 pg (ref 26.0–34.0)
MCHC: 34.7 g/dL (ref 30.0–36.0)
MCV: 87.6 fL (ref 78.0–100.0)
PLATELETS: 173 10*3/uL (ref 150–400)
RBC: 2.83 MIL/uL — AB (ref 3.87–5.11)
RDW: 13.8 % (ref 11.5–15.5)
WBC: 11.1 10*3/uL — AB (ref 4.0–10.5)

## 2017-09-26 NOTE — Anesthesia Postprocedure Evaluation (Signed)
Anesthesia Post Note  Patient: Ashley [name]  Procedure(s) Performed: AN AD HOC LABOR EPIDURAL     Patient location during evaluation: Mother Baby Anesthesia Type: Epidural Level of consciousness: awake and alert Pain management: pain level controlled Vital Signs Assessment: post-procedure vital signs reviewed and stable Respiratory status: spontaneous breathing, nonlabored ventilation and respiratory function stable Cardiovascular status: stable Postop Assessment: no headache, no backache and epidural receding Anesthetic complications: no    Last Vitals:  Vitals:   09/25/17 2230 09/26/17 0540  BP: (!) 144/73 (!) 150/75  Pulse: 90 71  Resp: 20 20  Temp: 36.8 C 36.6 C    Last Pain:  Vitals:   09/26/17 0836  TempSrc:   PainSc: 3    Pain Goal: Patients Stated Pain Goal: 4 (09/24/17 1420)               Yariana Hoaglund

## 2017-09-26 NOTE — Progress Notes (Signed)
Patient is doing well.  She is ambulating, voiding, tolerating PO.  Pain control is good.  Lochia is appropriate Denies ha/bv/ruq pain.  BPs 140-150/70-80s since delivery  Vitals:   09/25/17 1731 09/25/17 1823 09/25/17 2230 09/26/17 0540  BP: 136/76 (!) 148/87 (!) 144/73 (!) 150/75  Pulse: 94 97 90 71  Resp: 18 18 20 20   Temp: 98.3 F (36.8 C) 98.5 F (36.9 C) 98.2 F (36.8 C) 97.8 F (36.6 C)  TempSrc: Oral Oral Oral Oral  Weight:      Height:        NAD Fundus firm Ext:   Lab Results  Component Value Date   WBC 11.1 (H) 09/26/2017   HGB 8.6 (L) 09/26/2017   HCT 24.8 (L) 09/26/2017   MCV 87.6 09/26/2017   PLT 173 09/26/2017    --/--/O POS, O POS Performed at [facility], [address], [place], [place] [address]  (02/18 0254)/[place]  A/P 42 y.o. G2P1011 PPD#1 s/p TSVD GHTN--BPs mild range, 140-150/80s.  Pt asymptomatic--will monitor closely today w q4h bp to eval need for po anti-hypertensive   Desires circumcision. Discussed r/b/a of the procedure. Reviewed that circumcision is an elective surgical procedure and not considered medically necessary. Reviewed the risks of the procedure including the risk of infection, bleeding, damage to surrounding structures, including scrotum, shaft, urethra and head of penis, and an undesired cosmetic effect requiring additional procedures for revision. Consent signed.    [place]

## 2017-09-27 NOTE — Progress Notes (Signed)
Patient is eating, ambulating, voiding.  Pain control is good.  Vitals:   09/26/17 2330 09/26/17 2352 09/27/17 0302 09/27/17 0719  BP: (!) 164/73 136/71 126/70 131/65  Pulse: 100 97 88 86  Resp: 18 18 18 18   Temp: 97.6 F (36.4 C)  98.1 F (36.7 C) 98 F (36.7 C)  TempSrc: Oral  Oral Oral  Weight:      Height:        Fundus firm Perineum without swelling.  Lab Results  Component Value Date   WBC 11.1 (H) 09/26/2017   HGB 8.6 (L) 09/26/2017   HCT 24.8 (L) 09/26/2017   MCV 87.6 09/26/2017   PLT 173 09/26/2017    --/--/O POS, O POS Performed at [facility], [address] [place], [place] [address]  (02/18 0254)/[place]  A/P Post partum day 2.  Routine care.  Expect d/c today. Iron.   Ashley [name] A

## 2017-09-27 NOTE — Discharge Summary (Signed)
Obstetric Discharge Summary Reason for Admission: induction of labor Prenatal Procedures: NST Intrapartum Procedures: spontaneous vaginal delivery Postpartum Procedures: none Complications-Operative and Postpartum: 2 degree perineal laceration Hemoglobin  Date Value Ref Range Status  09/26/2017 8.6 (L) 12.0 - 15.0 g/dL Final    Comment:    REPEATED TO VERIFY DELTA CHECK NOTED    HCT  Date Value Ref Range Status  09/26/2017 24.8 (L) 36.0 - 46.0 % Final    Discharge Diagnoses: Term Pregnancy-delivered  Discharge Information: Date: 09/27/2017 Activity: pelvic rest Diet: routine Medications: Ibuprofen and Iron Condition: stable Instructions: refer to practice specific booklet Discharge to: home Follow-up Information    Jerelyn Charles, MD Follow up in 4 week(s).   Specialty:  Obstetrics Contact information: [place] [address] [phone]           Newborn Data: Live born female  Birth Weight: 7 lb 15.9 oz (3625 g) APGAR: 7, 9  Newborn Delivery   Birth date/time:  09/25/2017 15:12:00 Delivery type:  Vaginal, Spontaneous     Home with mother.  Ashley [name] A 09/27/2017, 7:41 AM

## 2017-10-08 IMAGING — DX DG CHEST 2V
2 series · 2 of 2 positions shown · non-contrast
Comparison: None.

CLINICAL DATA: Central chest pain radiates to the back with
shortness of Breath starting 2 hours ago.

EXAM:
CHEST  2 VIEW

[chest pa]
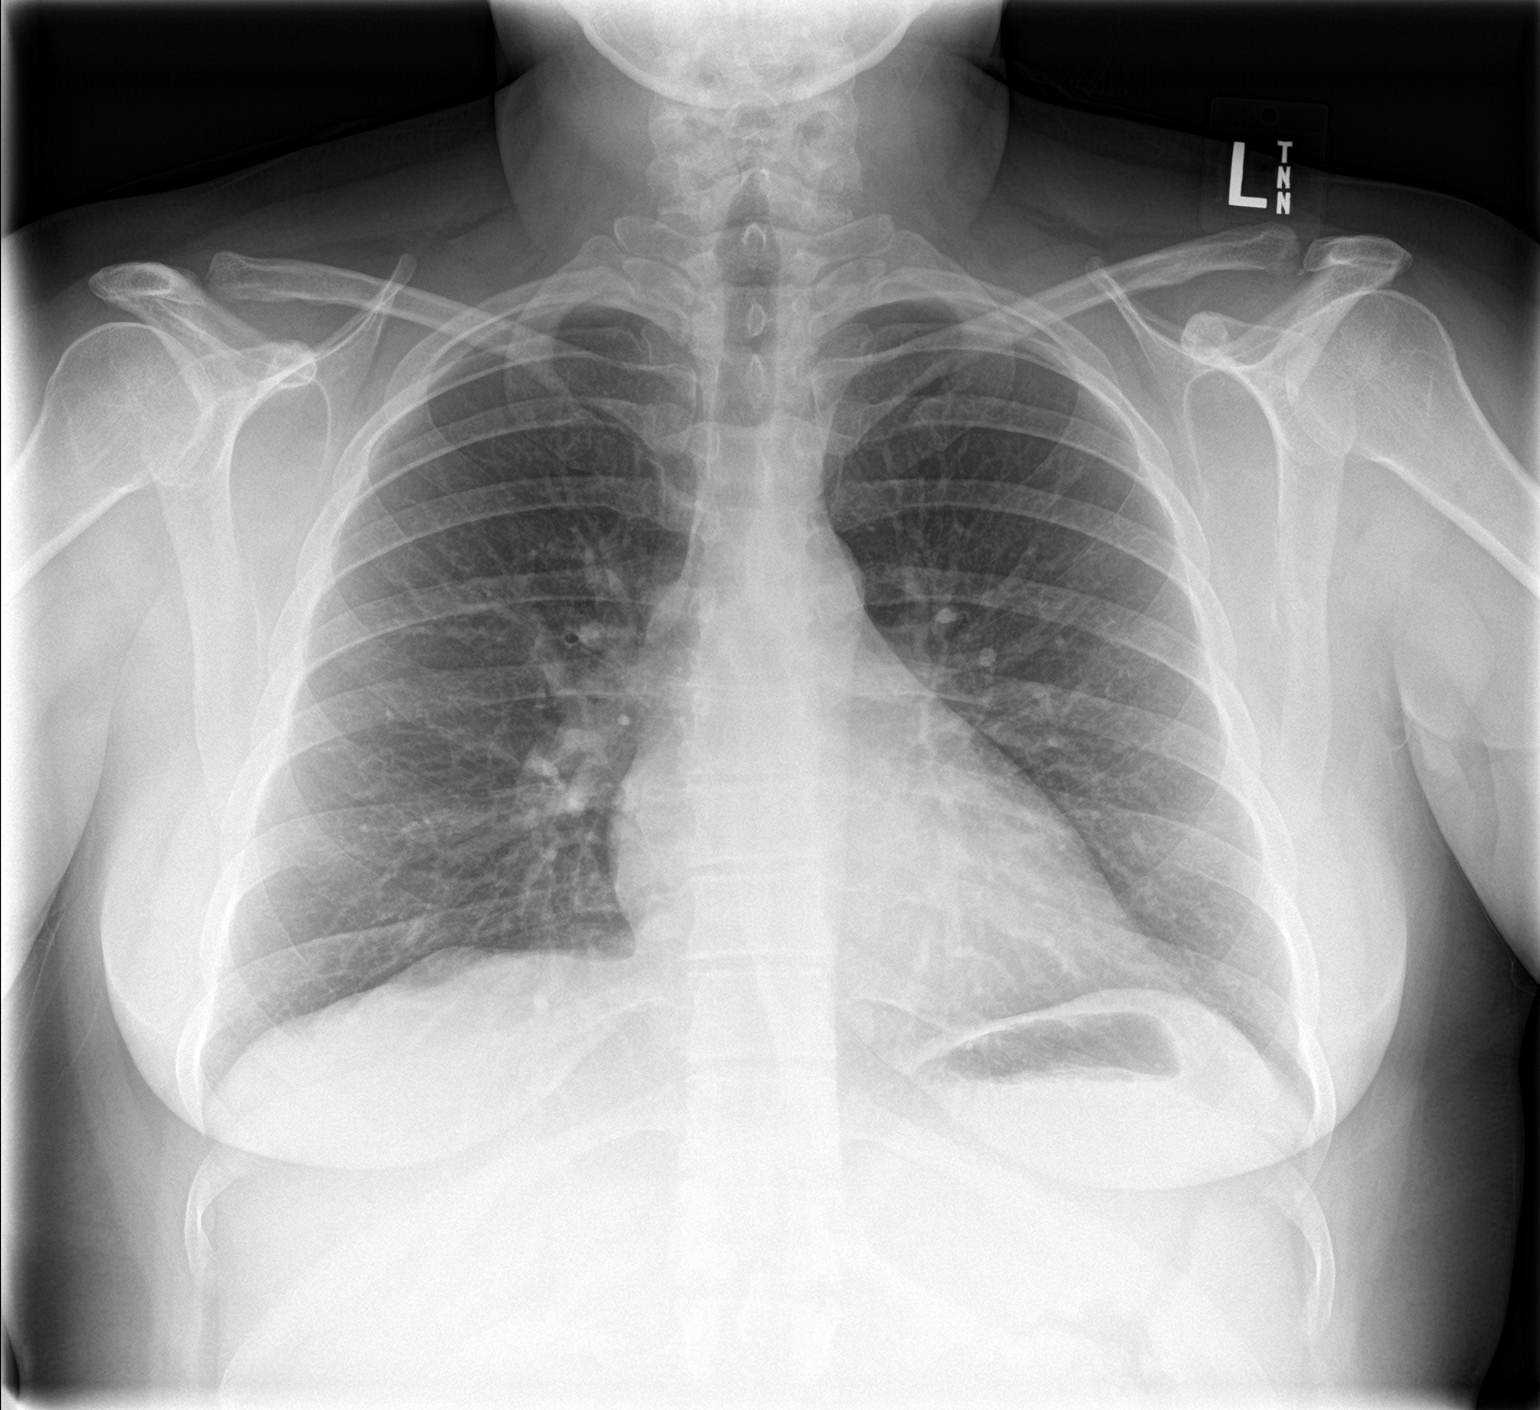

[chest lat]
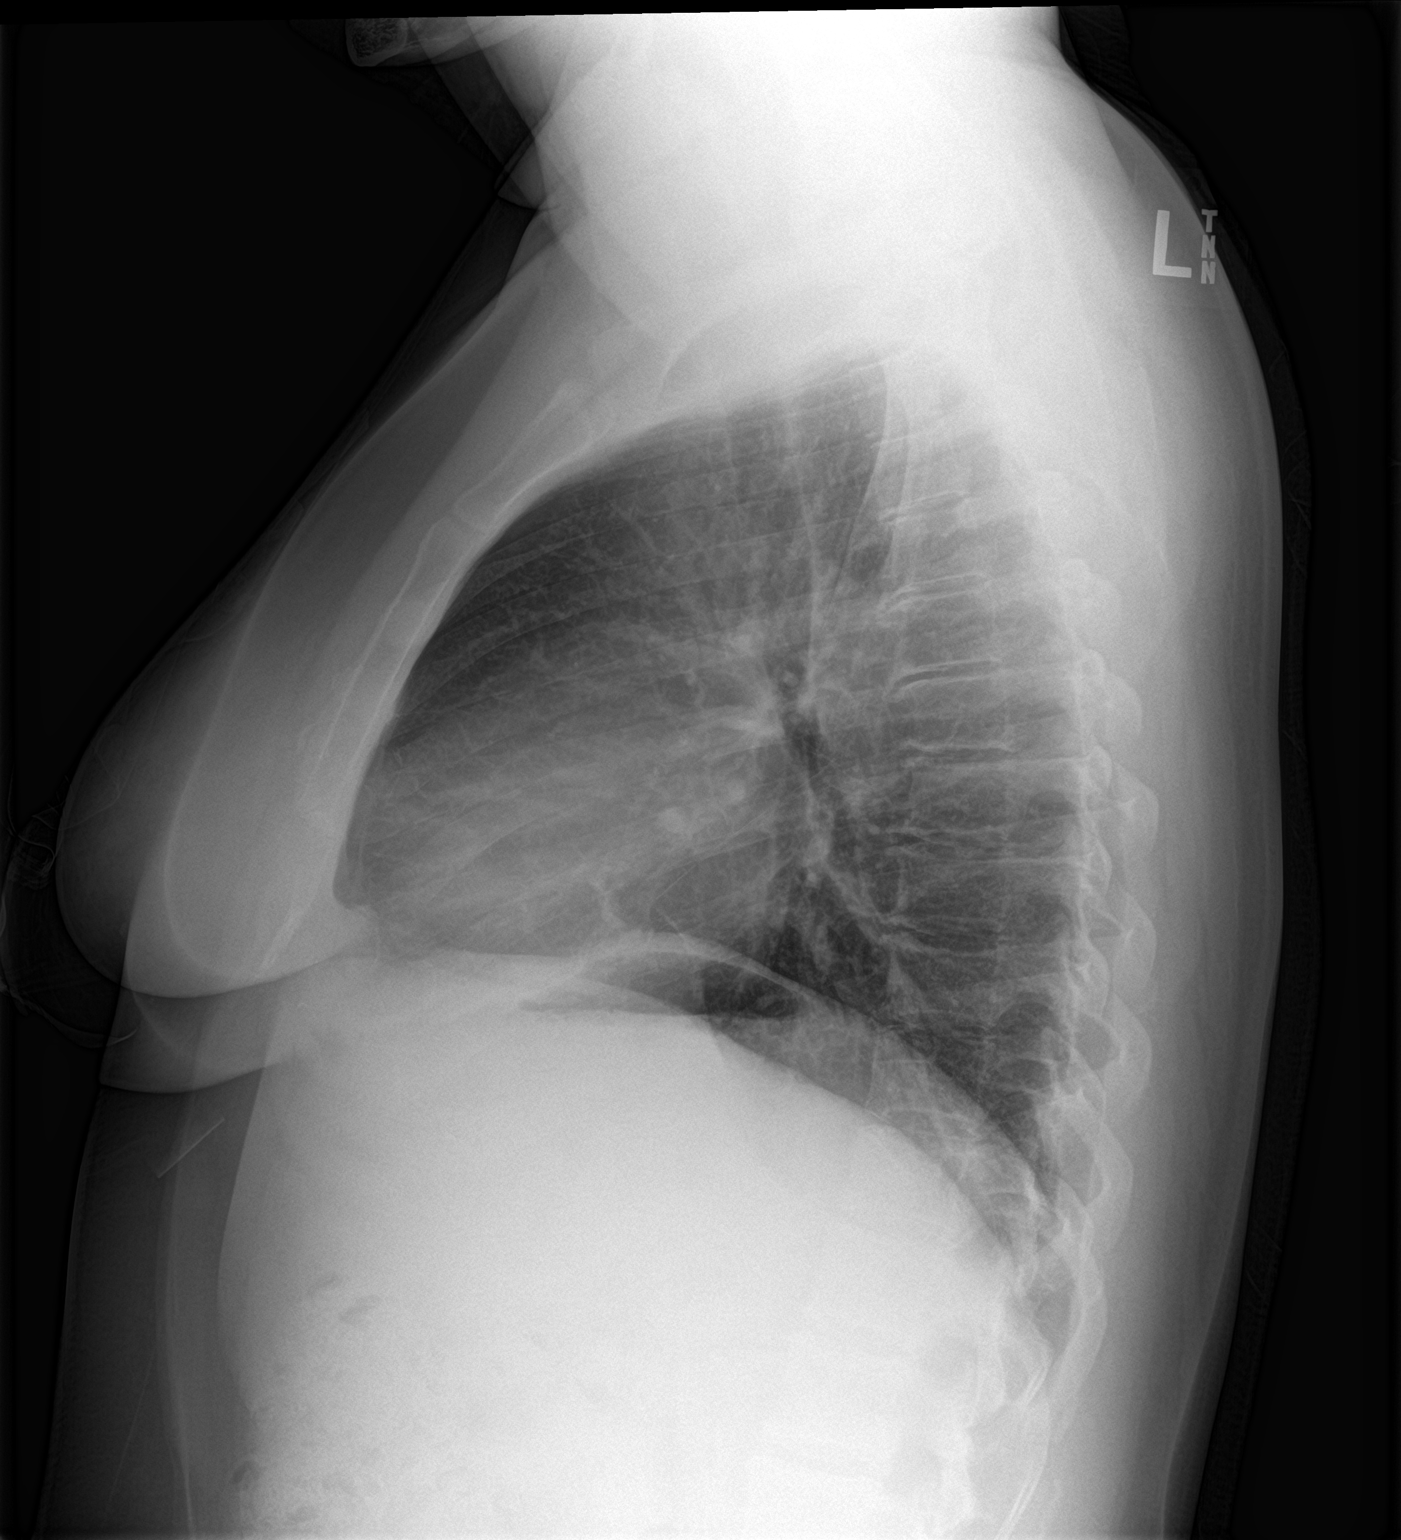

[2 of 2 positions shown; findings below may reference images not displayed]

FINDINGS: The heart size and mediastinal contours are within normal limits.
Both lungs are clear. The visualized skeletal structures are
unremarkable.
IMPRESSION: No active cardiopulmonary disease.

## 2018-01-04 ENCOUNTER — Other Ambulatory Visit: Payer: Self-pay

## 2018-01-04 DIAGNOSIS — D229 Melanocytic nevi, unspecified: Secondary | ICD-10-CM

## 2018-01-04 HISTORY — DX: Melanocytic nevi, unspecified: D22.9

## 2018-05-16 ENCOUNTER — Other Ambulatory Visit: Payer: Self-pay

## 2020-08-23 ENCOUNTER — Other Ambulatory Visit: Payer: Self-pay

## 2020-08-23 ENCOUNTER — Emergency Department (HOSPITAL_COMMUNITY): Payer: BC Managed Care – PPO

## 2020-08-23 ENCOUNTER — Emergency Department (HOSPITAL_COMMUNITY)
Admission: EM | Admit: 2020-08-23 | Discharge: 2020-08-23 | Disposition: A | Discharge status: Home / Self Care | Payer: BC Managed Care – PPO | Attending: Emergency Medicine | Admitting: Emergency Medicine

## 2020-08-23 ENCOUNTER — Encounter (HOSPITAL_COMMUNITY): Payer: Self-pay | Admitting: Emergency Medicine

## 2020-08-23 DIAGNOSIS — K8020 Calculus of gallbladder without cholecystitis without obstruction: Secondary | ICD-10-CM | POA: Diagnosis not present

## 2020-08-23 DIAGNOSIS — R1011 Right upper quadrant pain: Secondary | ICD-10-CM | POA: Diagnosis present

## 2020-08-23 LAB — URINALYSIS, ROUTINE W REFLEX MICROSCOPIC
Bacteria, UA: NONE SEEN
Bilirubin Urine: NEGATIVE
Glucose, UA: NEGATIVE mg/dL
Ketones, ur: 5 mg/dL — AB
Leukocytes,Ua: NEGATIVE
Nitrite: NEGATIVE
Protein, ur: NEGATIVE mg/dL
Specific Gravity, Urine: 1.029 (ref 1.005–1.030)
pH: 5 (ref 5.0–8.0)

## 2020-08-23 LAB — CBC
HCT: 38.5 % (ref 36.0–46.0)
Hemoglobin: 13.9 g/dL (ref 12.0–15.0)
MCH: 32.3 pg (ref 26.0–34.0)
MCHC: 36.1 g/dL — ABNORMAL HIGH (ref 30.0–36.0)
MCV: 89.5 fL (ref 80.0–100.0)
Platelets: 232 10*3/uL (ref 150–400)
RBC: 4.3 MIL/uL (ref 3.87–5.11)
RDW: 12.8 % (ref 11.5–15.5)
WBC: 6.1 10*3/uL (ref 4.0–10.5)
nRBC: 0 % (ref 0.0–0.2)

## 2020-08-23 LAB — COMPREHENSIVE METABOLIC PANEL
ALT: 13 U/L (ref 0–44)
AST: 15 U/L (ref 15–41)
Albumin: 4.2 g/dL (ref 3.5–5.0)
Alkaline Phosphatase: 59 U/L (ref 38–126)
Anion gap: 12 (ref 5–15)
BUN: 12 mg/dL (ref 6–20)
CO2: 19 mmol/L — ABNORMAL LOW (ref 22–32)
Calcium: 8.7 mg/dL — ABNORMAL LOW (ref 8.9–10.3)
Chloride: 108 mmol/L (ref 98–111)
Creatinine, Ser: 0.76 mg/dL (ref 0.44–1.00)
GFR, Estimated: >60 mL/min (ref >60)
Glucose, Bld: 118 mg/dL — ABNORMAL HIGH (ref 70–99)
Potassium: 3.9 mmol/L (ref 3.5–5.1)
Sodium: 139 mmol/L (ref 135–145)
Total Bilirubin: 0.7 mg/dL (ref 0.3–1.2)
Total Protein: 6.9 g/dL (ref 6.5–8.1)

## 2020-08-23 LAB — LIPASE, BLOOD: Lipase: 24 U/L (ref 11–51)

## 2020-08-23 MED ORDER — OXYCODONE-ACETAMINOPHEN 5-325 MG PO TABS: 1.0000 | ORAL_TABLET | ORAL | 0 refills | Status: DC | PRN

## 2020-08-23 MED ORDER — METOCLOPRAMIDE HCL 10 MG PO TABS
10.0000 mg | ORAL_TABLET | Freq: Once | ORAL | Status: AC
  Administered 2020-08-23: 10 mg via ORAL
  Filled 2020-08-23: qty 1

## 2020-08-23 MED ORDER — HYDROMORPHONE HCL 1 MG/ML IJ SOLN
0.5000 mg | Freq: Once | INTRAMUSCULAR | Status: AC
  Administered 2020-08-23: 0.5 mg via INTRAVENOUS
  Filled 2020-08-23: qty 1

## 2020-08-23 MED ORDER — OXYCODONE-ACETAMINOPHEN 5-325 MG PO TABS
1.0000 | ORAL_TABLET | Freq: Once | ORAL | Status: AC
  Administered 2020-08-23: 1 via ORAL
  Filled 2020-08-23: qty 1

## 2020-08-23 MED ORDER — ONDANSETRON 4 MG PO TBDP: 4.0000 mg | ORAL_TABLET | Freq: Three times a day (TID) | ORAL | 0 refills | Status: DC | PRN

## 2020-08-23 MED ORDER — HYOSCYAMINE SULFATE 0.5 MG/ML IJ SOLN
0.1250 mg | Freq: Once | INTRAMUSCULAR | Status: AC
Start: 2020-08-23 — End: 2020-08-23
  Administered 2020-08-23: 0.125 mg via INTRAVENOUS
  Filled 2020-08-23: qty 0.25

## 2020-08-23 NOTE — Discharge Instructions (Signed)
Take the medication for pain as needed for severe pain. Zofran for nausea.

## 2020-08-23 NOTE — ED Provider Notes (Signed)
[facility] DEPT Provider Note   CSN: [id number] Arrival date & time: 08/23/20  0152     History Chief Complaint  Patient presents with  . Abdominal Pain  . Nausea    Ashley [name] is a 45 y.o. female.  Patient to ED with pain in the RUQ that radiates to her right shoulder. She reports symptoms have been ongoing, due for ultrasound next week, but pain became more severe after eating pizza tonight. No vomiting but she reports nausea. No fever. No diarrhea or fever.   The history is provided by the patient. No language interpreter was used.  Abdominal Pain Associated symptoms: nausea   Associated symptoms: no chest pain, no chills, no dysuria, no fever, no shortness of breath, no vaginal discharge and no vomiting        Past Medical History:  Diagnosis Date  . 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 Thyroid disease     Patient Active Problem List   Diagnosis Date Noted  . Pregnancy 09/24/2017  . Impaired glucose tolerance test 08/08/2017    History reviewed. No pertinent surgical history.   OB History    Gravida  2   Para  1   Term  1   Preterm      AB  1   Living  1     SAB  1   IAB      Ectopic      Multiple  0   Live Births  1           Family History  Problem Relation Age of Onset  . Diabetes Maternal Grandfather   . Heart disease Maternal Grandfather   . Hypertension Mother   . Diabetes Maternal Uncle     Social History   Tobacco Use  . Smoking status: Never Smoker  . Smokeless tobacco: Never Used  Substance Use Topics  . Alcohol use: No    Alcohol/week: 0.0 standard drinks  . Drug use: No    Home Medications Prior to Admission medications   Medication Sig Start Date End Date Taking? Authorizing Provider  levothyroxine (SYNTHROID,  LEVOTHROID) 150 MCG tablet Take 150 mcg by mouth daily before breakfast.    [provider]  metFORMIN (GLUCOPHAGE) 500 MG tablet Take 500 mg by mouth at bedtime.  08/31/17   [provider]  Prenatal Vit-Fe Fumarate-FA (PRENATAL MULTIVITAMIN) TABS tablet Take 1 tablet by mouth daily at 12 noon.    [provider]    Allergies    Patient has no known allergies.  Review of Systems   Review of Systems  Constitutional: Negative for chills and fever.  Respiratory: Negative for shortness of breath.   Cardiovascular: Negative for chest pain.  Gastrointestinal: Positive for abdominal pain and nausea. Negative for vomiting.  Genitourinary: Negative.  Negative for dysuria, flank pain and vaginal discharge.  Musculoskeletal:       RUQ pain radiates to shoulder.  Skin: Negative.   Neurological: Negative for weakness.    Physical Exam Updated Vital Signs BP (!) 201/104 (BP Location: Left Arm)   Pulse 70   Temp 98.2 F (36.8 C) (Oral)   Resp 18   Ht 5\' 3"  (1.6 m)   Wt 90.7 kg   SpO2 96%   BMI 35.43 kg/m   Physical Exam Constitutional:  Appearance: She is well-developed and well-nourished.  HENT:     Head: Normocephalic.  Cardiovascular:     Rate and Rhythm: Normal rate and regular rhythm.  Pulmonary:     Effort: Pulmonary effort is normal.     Breath sounds: Normal breath sounds.  Abdominal:     General: Bowel sounds are normal.     Palpations: Abdomen is soft. There is no hepatomegaly.     Tenderness: There is abdominal tenderness (Tender along lower liver border and lateral abdominal wall. ) in the right upper quadrant. There is no guarding or rebound.  Musculoskeletal:        General: Normal range of motion.     Cervical back: Normal range of motion and neck supple.  Skin:    General: Skin is warm and dry.     Findings: No rash.  Neurological:     Mental Status: She is alert and oriented to person, place, and time.  Psychiatric:        Mood  and Affect: Mood and affect normal.     ED Results / Procedures / Treatments   Labs (all labs ordered are listed, but only abnormal results are displayed) Labs Reviewed  COMPREHENSIVE METABOLIC PANEL - Abnormal; Notable for the following components:      Result Value   CO2 19 (*)    Glucose, Bld 118 (*)    Calcium 8.7 (*)    All other components within normal limits  CBC - Abnormal; Notable for the following components:   MCHC 36.1 (*)    All other components within normal limits  URINALYSIS, ROUTINE W REFLEX MICROSCOPIC - Abnormal; Notable for the following components:   Hgb urine dipstick SMALL (*)    Ketones, ur 5 (*)    All other components within normal limits  LIPASE, BLOOD  I-STAT BETA HCG BLOOD, ED ([facility], [facility], [facility] ONLY)    EKG None  Radiology US Abdomen Limited  Result Date: 08/23/2020 CLINICAL DATA:  Right upper quadrant abdominal pain EXAM: ULTRASOUND ABDOMEN LIMITED RIGHT UPPER QUADRANT COMPARISON:  None. FINDINGS: Gallbladder: There is cholelithiasis. There is no gallbladder wall thickening or pericholecystic free fluid. The sonographic Percell Miller sign is negative. Common bile duct: Diameter: 3 mm Liver: Diffuse increased echogenicity with slightly heterogeneous liver. Appearance typically secondary to fatty infiltration. Fibrosis secondary consideration. No secondary findings of cirrhosis noted. No focal hepatic lesion or intrahepatic biliary duct dilatation. Portal vein is patent on color Doppler imaging with normal direction of blood flow towards the liver. Other: None. IMPRESSION: There is cholelithiasis without secondary signs of acute cholecystitis. Hepatic steatosis Electronically Signed   By: Constance Holster M.D.   On: 08/23/2020 03:36    Procedures Procedures (including critical care time)  Medications Ordered in ED Medications  HYDROmorphone (DILAUDID) injection 0.5 mg (has no administration in time range)  hyoscyamine (LEVSIN) 0.5 MG/ML injection 0.125 mg  (has no administration in time range)  oxyCODONE-acetaminophen (PERCOCET/ROXICET) 5-325 MG per tablet 1 tablet (1 tablet Oral Given 08/23/20 0259)  metoCLOPramide (REGLAN) tablet 10 mg (10 mg Oral Given 08/23/20 0404)    ED Course  I have reviewed the triage vital signs and the nursing notes.  Pertinent labs & imaging results that were available during my care of the patient were reviewed by me and considered in my medical decision making (see chart for details).    MDM Rules/Calculators/A&P  Patient with recurrent RUQ pain after eating pizza earlier tonight. No vomiting, +nausea. No fever. Due to outpatient ultrasound next week but reports the pain was severe tonight prompting ED evaluation.   Overall well appearing. Mild RUQ tenderness. Symptoms follow pattern for gall bladder disease. Will obtain US RUQ tonight and evaluate for acute cholecystitis. Pain medication provided.   [place] confirms gall stones without evidence cholecystitis. No fever, leukocytosis, liver dysfunction. Pain no better with PO medications. Feel she is appropriate to be discharged if pain can be controlled. IV medications ordered.   On re-exam, she is much more comfortable. She is comfortable with discharge home. Encouraged to follow up with surgery for elective cholecystectomy.   Final Clinical Impression(s) / ED Diagnoses Final diagnoses:  None   1. Cholelithiasis    Rx / DC Orders ED Discharge Orders    None       Dennie Bible 08/24/20 [place], April, MD 08/24/20 [phone]

## 2020-08-23 NOTE — ED Triage Notes (Signed)
Pt reports R sided abdominal pain that shoots in to her back. She ate some pizza tonight and pain worsened. She is scheduled for a gall bladder US next week. Nauseous, no vomiting. A&Ox4.

## 2020-09-07 ENCOUNTER — Other Ambulatory Visit: Payer: Self-pay | Admitting: Obstetrics and Gynecology

## 2020-09-07 DIAGNOSIS — R1031 Right lower quadrant pain: Secondary | ICD-10-CM

## 2020-09-16 ENCOUNTER — Ambulatory Visit
Admission: RE | Admit: 2020-09-16 | Discharge: 2020-09-16 | Disposition: A | Discharge status: Home / Self Care | Payer: BC Managed Care – PPO | Source: Ambulatory Visit | Attending: Obstetrics and Gynecology | Admitting: Obstetrics and Gynecology

## 2020-09-16 DIAGNOSIS — R1031 Right lower quadrant pain: Secondary | ICD-10-CM

## 2020-09-23 ENCOUNTER — Other Ambulatory Visit: Payer: Self-pay | Admitting: Obstetrics and Gynecology

## 2020-09-23 DIAGNOSIS — N83201 Unspecified ovarian cyst, right side: Secondary | ICD-10-CM

## 2020-09-30 ENCOUNTER — Ambulatory Visit: Payer: Self-pay | Admitting: Surgery

## 2020-09-30 NOTE — H&P (Signed)
Surgical Evaluation  Chief Complaint: abdominal pain  HPI:  This is a very pleasant and otherwise healthy 45 year old Ashley [name] with a history of intermittent indigestion, upper abdominal pain and nausea. This began after the birth of her now 13-year-old son. She has had bouts of this quite some time but had significant episode of pain while she was at work when prompted her to go to the emergency room. This was in the epigastrium and right upper quadrant, radiating to her shoulder.  Associated with nausea and vomiting. Workup there revealed cholelithiasis without evidence of cholecystitis, lab work was unremarkable.  Her pain resolved with medication and so she was discharged for further follow-up.  She has not had any significant episodes since then.she did however develop some right lower quadrant pain which was worse during her menstrual cycle and has recently had an ultrasound demonstrating a hemorrhagic right ovarian cyst. Denies any prior abdominal surgery. She is a Engineer, structural.  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 Thyroid disease     No past surgical history on file.  Family History  Problem Relation Age of Onset  . Diabetes Maternal Grandfather   . Heart disease Maternal Grandfather   . Hypertension Mother   . Diabetes Maternal Uncle     Social History   Socioeconomic History  . Marital status: Married    Spouse name: Not on file  . Number of children: Not on file  . Years of education: Not on file  . Highest education level: Not on file  Occupational History  . Not on file  Tobacco Use  . Smoking status: Never Smoker  . Smokeless tobacco: Never Used  Substance and Sexual Activity  . Alcohol use: No    Alcohol/week: 0.0 standard drinks  . Drug use:  No  . Sexual activity: Yes    Birth control/protection: None  Other Topics Concern  . Not on file  Social History Narrative  . Not on file   Social Determinants of Health   Financial Resource Strain: Not on file  Food Insecurity: Not on file  Transportation Needs: Not on file  Physical Activity: Not on file  Stress: Not on file  Social Connections: Not on file    Current Outpatient Medications on File Prior to Visit  Medication Sig Dispense Refill  . levothyroxine (SYNTHROID, LEVOTHROID) 150 MCG tablet Take 150 mcg by mouth daily before breakfast.    . metFORMIN (GLUCOPHAGE) 500 MG tablet Take 500 mg by mouth at bedtime.     . ondansetron (ZOFRAN ODT) 4 MG disintegrating tablet Take 1 tablet (4 mg total) by mouth every 8 (eight) hours as needed for nausea or vomiting. 20 tablet 0  . oxyCODONE-acetaminophen (PERCOCET/ROXICET) 5-325 MG tablet Take 1 tablet by mouth every 4 (four) hours as needed for severe pain. 10 tablet 0  . Prenatal Vit-Fe Fumarate-FA (PRENATAL MULTIVITAMIN) TABS tablet Take 1 tablet by mouth daily at 12 noon.     No current facility-administered medications on file prior to visit.    Review of Systems: a complete, 10pt review of systems was completed with pertinent positives and negatives as documented in the HPI  Physical Exam: Vitals  Weight: 199.38 lb Height: 63.5in Body Surface Area: 1.94 m Body Mass Index: 34.76 kg/m  Temp.: 97.10F  Pulse: 96 (Regular)  P.OX: 99% (Room air) BP: 152/110(Sitting, Left Arm, Standard)  Alert and well-appearing, unlabored respirations. Abdomen is soft, minimally tender in the right subcostal margin. no mass or organomegaly.   CBC Latest Ref Rng & Units 08/23/2020 09/26/2017 09/24/2017  WBC 4.0 - 10.5 K/uL 6.1 11.1(H) 8.6  Hemoglobin 12.0 - 15.0 g/dL 13.9 8.6(L) 11.0(L)  Hematocrit 36.0 - Ashley.0 % 38.5 24.8(L) 31.8(L)  Platelets 150 - 400 K/uL 232 173 181    CMP Latest Ref Rng & Units 08/23/2020 09/24/2017  06/19/2016  Glucose 70 - 99 mg/dL 118(H) 80 102(H)  BUN 6 - 20 mg/dL 12 15 11   Creatinine 0.44 - 1.00 mg/dL 0.76 0.69 0.75  Sodium 135 - 145 mmol/L 139 134(L) 140  Potassium 3.5 - 5.1 mmol/L 3.9 4.2 3.9  Chloride 98 - 111 mmol/L 108 107 108  CO2 22 - 32 mmol/L 19(L) 18(L) 25  Calcium 8.9 - 10.3 mg/dL 8.7(L) 8.3(L) 9.3  Total Protein 6.5 - 8.1 g/dL 6.9 6.2(L) 7.3  Total Bilirubin 0.3 - 1.2 mg/dL 0.7 1.0 0.7  Alkaline Phos 38 - 126 U/L 59 131(H) 56  AST 15 - 41 U/L 15 21 18   ALT 0 - 44 U/L 13 11(L) 15    No results found for: INR, PROTIME  Imaging: No results found.   A/P: BILIARY COLIC (S50.53) Story: I recommend proceeding with laparoscopic/robotic cholecystectomy with possible cholangiogram. Discussed risks of surgery including bleeding, pain, scarring, intraabdominal injury specifically to the common bile duct and sequelae, bile leak, conversion to open surgery, failure to resolve symptoms, blood clots/ pulmonary embolus, heart attack, pneumonia, stroke, death. Questions welcomed and answered to patient's satisfaction. She wishes to proceed with surgery.    Patient Active Problem List   Diagnosis Date Noted  . Pregnancy 09/24/2017  . Impaired glucose tolerance test 08/08/2017       Ashley Ashley [name], [facility]  See AMION to contact appropriate on-call provider

## 2020-09-30 NOTE — H&P (View-Only) (Signed)
Surgical Evaluation  Chief Complaint: abdominal pain  HPI:  This is a very pleasant and otherwise healthy 45 year old woman with a history of intermittent indigestion, upper abdominal pain and nausea. This began after the birth of her now 22-year-old son. She has had bouts of this quite some time but had significant episode of pain while she was at work when prompted her to go to the emergency room. This was in the epigastrium and right upper quadrant, radiating to her shoulder.  Associated with nausea and vomiting. Workup there revealed cholelithiasis without evidence of cholecystitis, lab work was unremarkable.  Her pain resolved with medication and so she was discharged for further follow-up.  She has not had any significant episodes since then.she did however develop some right lower quadrant pain which was worse during her menstrual cycle and has recently had an ultrasound demonstrating a hemorrhagic right ovarian cyst. Denies any prior abdominal surgery. She is a Engineer, structural.  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 Thyroid disease     No past surgical history on file.  Family History  Problem Relation Age of Onset  . Diabetes Maternal Grandfather   . Heart disease Maternal Grandfather   . Hypertension Mother   . Diabetes Maternal Uncle     Social History   Socioeconomic History  . Marital status: Married    Spouse name: Not on file  . Number of children: Not on file  . Years of education: Not on file  . Highest education level: Not on file  Occupational History  . Not on file  Tobacco Use  . Smoking status: Never Smoker  . Smokeless tobacco: Never Used  Substance and Sexual Activity  . Alcohol use: No    Alcohol/week: 0.0 standard drinks  . Drug use:  No  . Sexual activity: Yes    Birth control/protection: None  Other Topics Concern  . Not on file  Social History Narrative  . Not on file   Social Determinants of Health   Financial Resource Strain: Not on file  Food Insecurity: Not on file  Transportation Needs: Not on file  Physical Activity: Not on file  Stress: Not on file  Social Connections: Not on file    Current Outpatient Medications on File Prior to Visit  Medication Sig Dispense Refill  . levothyroxine (SYNTHROID, LEVOTHROID) 150 MCG tablet Take 150 mcg by mouth daily before breakfast.    . metFORMIN (GLUCOPHAGE) 500 MG tablet Take 500 mg by mouth at bedtime.     . ondansetron (ZOFRAN ODT) 4 MG disintegrating tablet Take 1 tablet (4 mg total) by mouth every 8 (eight) hours as needed for nausea or vomiting. 20 tablet 0  . oxyCODONE-acetaminophen (PERCOCET/ROXICET) 5-325 MG tablet Take 1 tablet by mouth every 4 (four) hours as needed for severe pain. 10 tablet 0  . Prenatal Vit-Fe Fumarate-FA (PRENATAL MULTIVITAMIN) TABS tablet Take 1 tablet by mouth daily at 12 noon.     No current facility-administered medications on file prior to visit.    Review of Systems: a complete, 10pt review of systems was completed with pertinent positives and negatives as documented in the HPI  Physical Exam: Vitals  Weight: 199.38 lb Height: 63.5in Body Surface Area: 1.94 m Body Mass Index: 34.76 kg/m  Temp.: 97.75F  Pulse: 96 (Regular)  P.OX: 99% (Room air) BP: 152/110(Sitting, Left Arm, Standard)  Alert and well-appearing, unlabored respirations. Abdomen is soft, minimally tender in the right subcostal margin. no mass or organomegaly.   CBC Latest Ref Rng & Units 08/23/2020 09/26/2017 09/24/2017  WBC 4.0 - 10.5 K/uL 6.1 11.1(H) 8.6  Hemoglobin 12.0 - 15.0 g/dL 13.9 8.6(L) 11.0(L)  Hematocrit 36.0 - 46.0 % 38.5 24.8(L) 31.8(L)  Platelets 150 - 400 K/uL 232 173 181    CMP Latest Ref Rng & Units 08/23/2020 09/24/2017  06/19/2016  Glucose 70 - 99 mg/dL 118(H) 80 102(H)  BUN 6 - 20 mg/dL 12 15 11   Creatinine 0.44 - 1.00 mg/dL 0.76 0.69 0.75  Sodium 135 - 145 mmol/L 139 134(L) 140  Potassium 3.5 - 5.1 mmol/L 3.9 4.2 3.9  Chloride 98 - 111 mmol/L 108 107 108  CO2 22 - 32 mmol/L 19(L) 18(L) 25  Calcium 8.9 - 10.3 mg/dL 8.7(L) 8.3(L) 9.3  Total Protein 6.5 - 8.1 g/dL 6.9 6.2(L) 7.3  Total Bilirubin 0.3 - 1.2 mg/dL 0.7 1.0 0.7  Alkaline Phos 38 - 126 U/L 59 131(H) 56  AST 15 - 41 U/L 15 21 18   ALT 0 - 44 U/L 13 11(L) 15    No results found for: INR, PROTIME  Imaging: No results found.   A/P: BILIARY COLIC (M75.44) Story: I recommend proceeding with laparoscopic/robotic cholecystectomy with possible cholangiogram. Discussed risks of surgery including bleeding, pain, scarring, intraabdominal injury specifically to the common bile duct and sequelae, bile leak, conversion to open surgery, failure to resolve symptoms, blood clots/ pulmonary embolus, heart attack, pneumonia, stroke, death. Questions welcomed and answered to patient's satisfaction. She wishes to proceed with surgery.    Patient Active Problem List   Diagnosis Date Noted  . Pregnancy 09/24/2017  . Impaired glucose tolerance test 08/08/2017       Romana Juniper, [facility]  See AMION to contact appropriate on-call provider

## 2020-10-11 NOTE — Progress Notes (Addendum)
COVID Vaccine Completed:  x2 Date COVID Vaccine completed: 2nd dose 04/2020 Has received booster: COVID vaccine manufacturer: [employer]   Date of COVID positive in last 90 days:  N/A  PCP - Aggie Hacker, PA Cardiologist - N/A  Chest x-ray - N/A EKG - N/A Stress Test -  ECHO -  Cardiac Cath -  Pacemaker/ICD device last checked: Spinal Cord Stimulator:  Sleep Study - N/A CPAP -   Fasting Blood Sugar - N/A Checks Blood Sugar _____ times a day  Blood Thinner Instructions:N/A Aspirin Instructions: Last Dose:  Activity level:  Can go up a flight of stairs and perform activities of daily living without stopping and without symptoms of chest pain or shortness of breath.  Able to exercise without symptoms      Anesthesia review: N/A  Patient denies shortness of breath, fever, cough and chest pain at PAT appointment   Patient verbalized understanding of instructions that were given to them at the PAT appointment. Patient was also instructed that they will need to review over the PAT instructions again at home before surgery.

## 2020-10-11 NOTE — Patient Instructions (Addendum)
DUE TO COVID-19 ONLY ONE VISITOR IS ALLOWED TO COME WITH YOU AND STAY IN THE WAITING ROOM ONLY DURING PRE OP AND PROCEDURE.     COVID SWAB TESTING MUST BE COMPLETED ON:   Saturday, 10-16-20 @ 9:10 AM   [address] [place], [place] [address]  (Must self quarantine after testing. Follow instructions on handout.)        Your procedure is scheduled on:  Tuesday, 10-19-20   Report to [facility] Main  Entrance    Report to admitting at 9:45 AM   Call this number if you have problems the morning of surgery [phone]   Do not eat food :After Midnight.   May have liquids until 8:45 AM day of surgery  CLEAR LIQUID DIET  Foods Allowed                                                                     Foods Excluded  Water, Black Coffee and tea, regular and decaf              liquids that you cannot  Plain Jell-O in any flavor  (No red)                                     see through such as: Fruit ices (not with fruit pulp)                                      milk, soups, orange juice              Iced Popsicles (No red)                                      All solid food                                   Apple juices Sports drinks like Gatorade (No red) Lightly seasoned clear broth or consume(fat free) Sugar, honey syrup    Oral Hygiene is also important to reduce your risk of infection.                                    Remember - BRUSH YOUR TEETH THE MORNING OF SURGERY WITH YOUR REGULAR TOOTHPASTE   Do NOT smoke after Midnight   Take these medicines the morning of surgery with A SIP OF WATER:   Euthyrox, Alesse                               You may not have any metal on your body including hair pins, jewelry, and body piercings             Do not wear make-up, lotions, powders, perfumes/cologne, or deodorant  Do not wear nail polish.  Do not shave  48 hours prior to surgery.    Do not bring valuables to the hospital. [place].   Contacts, dentures or bridgework may not be worn into surgery.   Patients discharged the day of surgery will not be allowed to drive home.   Please read over the following fact sheets you were given: IF YOU HAVE QUESTIONS ABOUT YOUR PRE OP INSTRUCTIONS PLEASE CALL  [place] - Preparing for Surgery Before surgery, you can play an important role.  Because skin is not sterile, your skin needs to be as free of germs as possible.  You can reduce the number of germs on your skin by washing with CHG (chlorahexidine gluconate) soap before surgery.  CHG is an antiseptic cleaner which kills germs and bonds with the skin to continue killing germs even after washing. Please DO NOT use if you have an allergy to CHG or antibacterial soaps.  If your skin becomes reddened/irritated stop using the CHG and inform your nurse when you arrive at Short Stay. Do not shave (including legs and underarms) for at least 48 hours prior to the first CHG shower.  You may shave your face/neck.  Please follow these instructions carefully:  1.  Shower with CHG Soap the night before surgery and the  morning of surgery.  2.  If you choose to wash your hair, wash your hair first as usual with your normal  shampoo.  3.  After you shampoo, rinse your hair and body thoroughly to remove the shampoo.                             4.  Use CHG as you would any other liquid soap.  You can apply chg directly to the skin and wash.  Gently with a scrungie or clean washcloth.  5.  Apply the CHG Soap to your body ONLY FROM THE NECK DOWN.   Do   not use on face/ open                           Wound or open sores. Avoid contact with eyes, ears mouth and   genitals (private parts).                       Wash face,  Genitals (private parts) with your normal soap.             6.  Wash thoroughly, paying special attention to the area where your    surgery  will be performed.  7.  Thoroughly rinse  your body with warm water from the neck down.  8.  DO NOT shower/wash with your normal soap after using and rinsing off the CHG Soap.                9.  Pat yourself dry with a clean towel.            10.  Wear clean pajamas.            11.  Place clean sheets on your bed the night of your first shower and do not  sleep with pets. Day of Surgery : Do not apply any lotions/deodorants the morning of surgery.  Please wear clean clothes to the hospital/surgery center.  FAILURE TO FOLLOW THESE INSTRUCTIONS MAY RESULT IN THE CANCELLATION OF YOUR SURGERY  PATIENT SIGNATURE_________________________________  NURSE SIGNATURE__________________________________  ________________________________________________________________________

## 2020-10-14 ENCOUNTER — Other Ambulatory Visit: Payer: Self-pay

## 2020-10-14 ENCOUNTER — Encounter (HOSPITAL_COMMUNITY): Payer: Self-pay

## 2020-10-14 ENCOUNTER — Encounter (HOSPITAL_COMMUNITY)
Admission: RE | Admit: 2020-10-14 | Discharge: 2020-10-14 | Disposition: A | Discharge status: Home / Self Care | Payer: BC Managed Care – PPO | Source: Ambulatory Visit | Attending: Surgery | Admitting: Surgery

## 2020-10-14 DIAGNOSIS — Z01812 Encounter for preprocedural laboratory examination: Secondary | ICD-10-CM | POA: Insufficient documentation

## 2020-10-14 HISTORY — DX: Hypothyroidism, unspecified: E03.9

## 2020-10-14 HISTORY — DX: Unspecified ovarian cyst, unspecified side: N83.209

## 2020-10-14 LAB — CBC
HCT: 37.8 % (ref 36.0–46.0)
Hemoglobin: 13.6 g/dL (ref 12.0–15.0)
MCH: 32.3 pg (ref 26.0–34.0)
MCHC: 36 g/dL (ref 30.0–36.0)
MCV: 89.8 fL (ref 80.0–100.0)
Platelets: 241 10*3/uL (ref 150–400)
RBC: 4.21 MIL/uL (ref 3.87–5.11)
RDW: 12.3 % (ref 11.5–15.5)
WBC: 5 10*3/uL (ref 4.0–10.5)
nRBC: 0 % (ref 0.0–0.2)

## 2020-10-16 ENCOUNTER — Other Ambulatory Visit (HOSPITAL_COMMUNITY)
Admission: RE | Admit: 2020-10-16 | Discharge: 2020-10-16 | Disposition: A | Discharge status: Home / Self Care | Payer: BC Managed Care – PPO | Source: Ambulatory Visit | Attending: Surgery | Admitting: Surgery

## 2020-10-16 DIAGNOSIS — Z20822 Contact with and (suspected) exposure to covid-19: Secondary | ICD-10-CM | POA: Diagnosis not present

## 2020-10-16 DIAGNOSIS — Z01812 Encounter for preprocedural laboratory examination: Secondary | ICD-10-CM | POA: Insufficient documentation

## 2020-10-16 DIAGNOSIS — Z79890 Hormone replacement therapy (postmenopausal): Secondary | ICD-10-CM | POA: Diagnosis not present

## 2020-10-16 DIAGNOSIS — K8044 Calculus of bile duct with chronic cholecystitis without obstruction: Secondary | ICD-10-CM | POA: Diagnosis not present

## 2020-10-16 DIAGNOSIS — Z7984 Long term (current) use of oral hypoglycemic drugs: Secondary | ICD-10-CM | POA: Diagnosis not present

## 2020-10-16 LAB — SARS CORONAVIRUS 2 (TAT 6-24 HRS): SARS Coronavirus 2: NEGATIVE

## 2020-10-18 MED ORDER — BUPIVACAINE LIPOSOME 1.3 % IJ SUSP
20.0000 mL | Freq: Once | INTRAMUSCULAR | Status: DC
  Filled 2020-10-18: qty 20

## 2020-10-19 ENCOUNTER — Encounter (HOSPITAL_COMMUNITY)
Admission: RE | Disposition: A | Discharge status: Home / Self Care | Payer: Self-pay | Source: Home / Self Care | Attending: Surgery

## 2020-10-19 ENCOUNTER — Encounter (HOSPITAL_COMMUNITY): Payer: Self-pay | Admitting: Surgery

## 2020-10-19 ENCOUNTER — Ambulatory Visit (HOSPITAL_COMMUNITY): Payer: BC Managed Care – PPO | Admitting: Anesthesiology

## 2020-10-19 ENCOUNTER — Ambulatory Visit (HOSPITAL_COMMUNITY)
Admission: RE | Admit: 2020-10-19 | Discharge: 2020-10-19 | Disposition: A | Discharge status: Home / Self Care | Payer: BC Managed Care – PPO | Attending: Surgery | Admitting: Surgery

## 2020-10-19 DIAGNOSIS — Z7984 Long term (current) use of oral hypoglycemic drugs: Secondary | ICD-10-CM | POA: Insufficient documentation

## 2020-10-19 DIAGNOSIS — K8044 Calculus of bile duct with chronic cholecystitis without obstruction: Secondary | ICD-10-CM | POA: Diagnosis not present

## 2020-10-19 DIAGNOSIS — Z79890 Hormone replacement therapy (postmenopausal): Secondary | ICD-10-CM | POA: Insufficient documentation

## 2020-10-19 DIAGNOSIS — Z20822 Contact with and (suspected) exposure to covid-19: Secondary | ICD-10-CM | POA: Insufficient documentation

## 2020-10-19 LAB — PREGNANCY, URINE: Preg Test, Ur: NEGATIVE

## 2020-10-19 SURGERY — CHOLECYSTECTOMY, ROBOT-ASSISTED, LAPAROSCOPIC
Anesthesia: General

## 2020-10-19 MED ORDER — ACETAMINOPHEN 500 MG PO TABS
1000.0000 mg | ORAL_TABLET | ORAL | Status: AC
  Administered 2020-10-19: 1000 mg via ORAL
  Filled 2020-10-19: qty 2

## 2020-10-19 MED ORDER — ONDANSETRON HCL 4 MG/2ML IJ SOLN
INTRAMUSCULAR | Status: AC
  Filled 2020-10-19: qty 2

## 2020-10-19 MED ORDER — SUGAMMADEX SODIUM 200 MG/2ML IV SOLN
INTRAVENOUS | Status: DC | PRN
  Administered 2020-10-19: 200 mg via INTRAVENOUS

## 2020-10-19 MED ORDER — OXYCODONE HCL 5 MG PO TABS
5.0000 mg | ORAL_TABLET | Freq: Once | ORAL | Status: AC | PRN
  Administered 2020-10-19: 5 mg via ORAL

## 2020-10-19 MED ORDER — SODIUM CHLORIDE 0.9% FLUSH: 3.0000 mL | Freq: Two times a day (BID) | INTRAVENOUS | Status: DC

## 2020-10-19 MED ORDER — DEXAMETHASONE SODIUM PHOSPHATE 10 MG/ML IJ SOLN
INTRAMUSCULAR | Status: DC | PRN
  Administered 2020-10-19: 5 mg via INTRAVENOUS

## 2020-10-19 MED ORDER — DOCUSATE SODIUM 100 MG PO CAPS: 100.0000 mg | ORAL_CAPSULE | Freq: Two times a day (BID) | ORAL | 0 refills | Status: AC

## 2020-10-19 MED ORDER — FENTANYL CITRATE (PF) 100 MCG/2ML IJ SOLN
INTRAMUSCULAR | Status: DC | PRN
  Administered 2020-10-19 (×2): 50 ug via INTRAVENOUS
  Administered 2020-10-19: 100 ug via INTRAVENOUS

## 2020-10-19 MED ORDER — SODIUM CHLORIDE 0.9% FLUSH: 3.0000 mL | INTRAVENOUS | Status: DC | PRN

## 2020-10-19 MED ORDER — PHENYLEPHRINE 40 MCG/ML (10ML) SYRINGE FOR IV PUSH (FOR BLOOD PRESSURE SUPPORT)
PREFILLED_SYRINGE | INTRAVENOUS | Status: AC
  Filled 2020-10-19: qty 10

## 2020-10-19 MED ORDER — MIDAZOLAM HCL 2 MG/2ML IJ SOLN
INTRAMUSCULAR | Status: AC
  Filled 2020-10-19: qty 2

## 2020-10-19 MED ORDER — PHENYLEPHRINE 40 MCG/ML (10ML) SYRINGE FOR IV PUSH (FOR BLOOD PRESSURE SUPPORT)
PREFILLED_SYRINGE | INTRAVENOUS | Status: DC | PRN
  Administered 2020-10-19 (×3): 80 ug via INTRAVENOUS
  Administered 2020-10-19: 120 ug via INTRAVENOUS

## 2020-10-19 MED ORDER — SODIUM CHLORIDE 0.9 % IV SOLN: 250.0000 mL | INTRAVENOUS | Status: DC | PRN

## 2020-10-19 MED ORDER — FENTANYL CITRATE (PF) 250 MCG/5ML IJ SOLN
INTRAMUSCULAR | Status: AC
  Filled 2020-10-19: qty 5

## 2020-10-19 MED ORDER — PROPOFOL 10 MG/ML IV BOLUS
INTRAVENOUS | Status: AC
  Filled 2020-10-19: qty 20

## 2020-10-19 MED ORDER — FENTANYL CITRATE (PF) 100 MCG/2ML IJ SOLN
25.0000 ug | INTRAMUSCULAR | Status: DC | PRN
  Administered 2020-10-19: 50 ug via INTRAVENOUS

## 2020-10-19 MED ORDER — AMISULPRIDE (ANTIEMETIC) 5 MG/2ML IV SOLN
10.0000 mg | Freq: Once | INTRAVENOUS | Status: AC
  Administered 2020-10-19: 10 mg via INTRAVENOUS

## 2020-10-19 MED ORDER — ORAL CARE MOUTH RINSE: 15.0000 mL | Freq: Once | OROMUCOSAL | Status: AC

## 2020-10-19 MED ORDER — CHLORHEXIDINE GLUCONATE 0.12 % MT SOLN
15.0000 mL | Freq: Once | OROMUCOSAL | Status: AC
  Administered 2020-10-19: 15 mL via OROMUCOSAL

## 2020-10-19 MED ORDER — FENTANYL CITRATE (PF) 100 MCG/2ML IJ SOLN
INTRAMUSCULAR | Status: AC
  Filled 2020-10-19: qty 2

## 2020-10-19 MED ORDER — CHLORHEXIDINE GLUCONATE 4 % EX LIQD: 60.0000 mL | Freq: Once | CUTANEOUS | Status: DC

## 2020-10-19 MED ORDER — OXYCODONE HCL 5 MG PO TABS
ORAL_TABLET | ORAL | Status: AC
  Filled 2020-10-19: qty 1

## 2020-10-19 MED ORDER — ACETAMINOPHEN 650 MG RE SUPP: 650.0000 mg | RECTAL | Status: DC | PRN

## 2020-10-19 MED ORDER — BUPIVACAINE-EPINEPHRINE 0.25% -1:200000 IJ SOLN
INTRAMUSCULAR | Status: DC | PRN
  Administered 2020-10-19: 30 mL

## 2020-10-19 MED ORDER — INDOCYANINE GREEN 25 MG IV SOLR
2.5000 mg | Freq: Once | INTRAVENOUS | Status: AC
  Administered 2020-10-19: 2.5 mg via INTRAVENOUS
  Filled 2020-10-19: qty 1

## 2020-10-19 MED ORDER — LIDOCAINE 2% (20 MG/ML) 5 ML SYRINGE
INTRAMUSCULAR | Status: AC
  Filled 2020-10-19: qty 25

## 2020-10-19 MED ORDER — PROMETHAZINE HCL 25 MG/ML IJ SOLN: 6.2500 mg | INTRAMUSCULAR | Status: DC | PRN

## 2020-10-19 MED ORDER — LACTATED RINGERS IV SOLN: INTRAVENOUS | Status: DC

## 2020-10-19 MED ORDER — ACETAMINOPHEN 325 MG PO TABS: 650.0000 mg | ORAL_TABLET | ORAL | Status: DC | PRN

## 2020-10-19 MED ORDER — PROPOFOL 10 MG/ML IV BOLUS
INTRAVENOUS | Status: DC | PRN
  Administered 2020-10-19: 200 mg via INTRAVENOUS

## 2020-10-19 MED ORDER — CEFAZOLIN SODIUM-DEXTROSE 2-4 GM/100ML-% IV SOLN
2.0000 g | INTRAVENOUS | Status: AC
  Administered 2020-10-19: 2 g via INTRAVENOUS
  Filled 2020-10-19: qty 100

## 2020-10-19 MED ORDER — AMISULPRIDE (ANTIEMETIC) 5 MG/2ML IV SOLN
INTRAVENOUS | Status: AC
  Filled 2020-10-19: qty 4

## 2020-10-19 MED ORDER — LACTATED RINGERS IV SOLN
INTRAVENOUS | Status: DC | PRN
  Administered 2020-10-19: 1000 mL

## 2020-10-19 MED ORDER — TRAMADOL HCL 50 MG PO TABS: 50.0000 mg | ORAL_TABLET | Freq: Four times a day (QID) | ORAL | 0 refills | Status: AC | PRN

## 2020-10-19 MED ORDER — MIDAZOLAM HCL 5 MG/5ML IJ SOLN
INTRAMUSCULAR | Status: DC | PRN
  Administered 2020-10-19: 2 mg via INTRAVENOUS

## 2020-10-19 MED ORDER — ONDANSETRON HCL 4 MG/2ML IJ SOLN
INTRAMUSCULAR | Status: DC | PRN
  Administered 2020-10-19: 4 mg via INTRAVENOUS

## 2020-10-19 MED ORDER — OXYCODONE HCL 5 MG PO TABS: 5.0000 mg | ORAL_TABLET | ORAL | Status: DC | PRN

## 2020-10-19 MED ORDER — ROCURONIUM BROMIDE 10 MG/ML (PF) SYRINGE
PREFILLED_SYRINGE | INTRAVENOUS | Status: DC | PRN
  Administered 2020-10-19: 60 mg via INTRAVENOUS

## 2020-10-19 MED ORDER — BUPIVACAINE-EPINEPHRINE (PF) 0.25% -1:200000 IJ SOLN
INTRAMUSCULAR | Status: AC
  Filled 2020-10-19: qty 30

## 2020-10-19 MED ORDER — GABAPENTIN 300 MG PO CAPS
300.0000 mg | ORAL_CAPSULE | ORAL | Status: AC
  Administered 2020-10-19: 300 mg via ORAL
  Filled 2020-10-19: qty 1

## 2020-10-19 MED ORDER — 0.9 % SODIUM CHLORIDE (POUR BTL) OPTIME
TOPICAL | Status: DC | PRN
  Administered 2020-10-19: 1000 mL

## 2020-10-19 MED ORDER — LIDOCAINE 2% (20 MG/ML) 5 ML SYRINGE
INTRAMUSCULAR | Status: DC | PRN
  Administered 2020-10-19: 100 mg via INTRAVENOUS

## 2020-10-19 MED ORDER — OXYCODONE HCL 5 MG/5ML PO SOLN: 5.0000 mg | Freq: Once | ORAL | Status: AC | PRN

## 2020-10-19 SURGICAL SUPPLY — 50 items
ADH SKN CLS APL DERMABOND .7 (GAUZE/BANDAGES/DRESSINGS)
APL PRP STRL LF DISP 70% ISPRP (MISCELLANEOUS) ×1
APPLIER CLIP 5 13 M/L LIGAMAX5 (MISCELLANEOUS)
APR CLP MED LRG 5 ANG JAW (MISCELLANEOUS)
BLADE SURG SZ11 CARB STEEL (BLADE) ×2 IMPLANT
CHLORAPREP W/TINT 26 (MISCELLANEOUS) ×2 IMPLANT
CLIP APPLIE 5 13 M/L LIGAMAX5 (MISCELLANEOUS) IMPLANT
CLIP VESOLOCK LG 6/CT PURPLE (CLIP) ×3 IMPLANT
COVER TIP SHEARS 8 DVNC (MISCELLANEOUS) ×1 IMPLANT
COVER TIP SHEARS 8MM DA VINCI (MISCELLANEOUS) ×2
COVER WAND RF STERILE (DRAPES) ×2 IMPLANT
DECANTER SPIKE VIAL GLASS SM (MISCELLANEOUS) ×2 IMPLANT
DERMABOND ADVANCED (GAUZE/BANDAGES/DRESSINGS)
DERMABOND ADVANCED .7 DNX12 (GAUZE/BANDAGES/DRESSINGS) IMPLANT
DRAPE ARM DVNC X/XI (DISPOSABLE) ×4 IMPLANT
DRAPE COLUMN DVNC XI (DISPOSABLE) ×1 IMPLANT
DRAPE DA VINCI XI ARM (DISPOSABLE) ×8
DRAPE DA VINCI XI COLUMN (DISPOSABLE) ×2
ELECT REM PT RETURN 15FT ADLT (MISCELLANEOUS) ×2 IMPLANT
GLOVE SURG ENC MOIS LTX SZ6 (GLOVE) ×4 IMPLANT
GLOVE SURG UNDER LTX SZ6.5 (GLOVE) ×4 IMPLANT
GOWN STRL REUS W/TWL LRG LVL3 (GOWN DISPOSABLE) ×4 IMPLANT
GOWN STRL REUS W/TWL XL LVL3 (GOWN DISPOSABLE) IMPLANT
GRASPER SUT TROCAR 14GX15 (MISCELLANEOUS) ×2 IMPLANT
KIT BASIN OR (CUSTOM PROCEDURE TRAY) ×2 IMPLANT
KIT PROCEDURE DA VINCI SI (MISCELLANEOUS)
KIT PROCEDURE DVNC SI (MISCELLANEOUS) IMPLANT
KIT TURNOVER KIT A (KITS) ×2 IMPLANT
MANIFOLD NEPTUNE II (INSTRUMENTS) ×2 IMPLANT
NDL INSUFFLATION 14GA 120MM (NEEDLE) ×1 IMPLANT
NEEDLE HYPO 22GX1.5 SAFETY (NEEDLE) ×2 IMPLANT
NEEDLE INSUFFLATION 14GA 120MM (NEEDLE) ×2 IMPLANT
PACK CARDIOVASCULAR III (CUSTOM PROCEDURE TRAY) ×2 IMPLANT
SEAL CANN UNIV 5-8 DVNC XI (MISCELLANEOUS) ×4 IMPLANT
SEAL XI 5MM-8MM UNIVERSAL (MISCELLANEOUS) ×8
SEALER VESSEL DA VINCI XI (MISCELLANEOUS)
SEALER VESSEL EXT DVNC XI (MISCELLANEOUS) IMPLANT
SET IRRIG TUBING LAPAROSCOPIC (IRRIGATION / IRRIGATOR) IMPLANT
SOL ANTI FOG 6CC (MISCELLANEOUS) ×1 IMPLANT
SOLUTION ANTI FOG 6CC (MISCELLANEOUS) ×1
SOLUTION ELECTROLUBE (MISCELLANEOUS) ×2 IMPLANT
SPONGE LAP 18X18 RF (DISPOSABLE) ×2 IMPLANT
SUT MNCRL AB 4-0 PS2 18 (SUTURE) ×2 IMPLANT
SYR CONTROL 10ML LL (SYRINGE) ×2 IMPLANT
SYS RETRIEVAL 5MM INZII UNIV (BASKET) ×2
SYSTEM RETRIEVL 5MM INZII UNIV (BASKET) ×1 IMPLANT
TOWEL OR 17X26 10 PK STRL BLUE (TOWEL DISPOSABLE) ×2 IMPLANT
TOWEL OR NON WOVEN STRL DISP B (DISPOSABLE) ×2 IMPLANT
TRAY FOLEY MTR SLVR 16FR STAT (SET/KITS/TRAYS/PACK) IMPLANT
TUBING INSUFFLATION 10FT LAP (TUBING) ×2 IMPLANT

## 2020-10-19 NOTE — Anesthesia Preprocedure Evaluation (Addendum)
Anesthesia Evaluation  Patient identified by MRN, date of birth, ID band Patient awake    Reviewed: Allergy & Precautions, NPO status , Patient's Chart, lab work & pertinent test results  History of Anesthesia Complications Negative for: history of anesthetic complications  Airway Mallampati: II  TM Distance: >3 FB Neck ROM: Full    Dental no notable dental hx.    Pulmonary former smoker,    Pulmonary exam normal        Cardiovascular negative cardio ROS Normal cardiovascular exam     Neuro/Psych negative neurological ROS  negative psych ROS   GI/Hepatic negative GI ROS, Neg liver ROS,   Endo/Other  Hypothyroidism   Renal/GU negative Renal ROS  negative genitourinary   Musculoskeletal negative musculoskeletal ROS (+)   Abdominal   Peds  Hematology negative hematology ROS (+)   Anesthesia Other Findings Day of surgery medications reviewed with patient.  Reproductive/Obstetrics negative OB ROS                            Anesthesia Physical Anesthesia Plan  ASA: II  Anesthesia Plan: General   Post-op Pain Management:    Induction: Intravenous  PONV Risk Score and Plan: 4 or greater and Treatment may vary due to age or medical condition, Ondansetron, Dexamethasone, Midazolam and Scopolamine patch - Pre-op  Airway Management Planned: Oral ETT  Additional Equipment: None  Intra-op Plan:   Post-operative Plan: Extubation in OR  Informed Consent: I have reviewed the patients History and Physical, chart, labs and discussed the procedure including the risks, benefits and alternatives for the proposed anesthesia with the patient or authorized representative who has indicated his/her understanding and acceptance.     Dental advisory given  Plan Discussed with: CRNA  Anesthesia Plan Comments:        Anesthesia Quick Evaluation

## 2020-10-19 NOTE — Anesthesia Procedure Notes (Signed)
Procedure Name: Intubation Performed by: Gean Maidens, CRNA Pre-anesthesia Checklist: Patient identified, Emergency Drugs available, Suction available, Patient being monitored and Timeout performed Patient Re-evaluated:Patient Re-evaluated prior to induction Oxygen Delivery Method: Circle system utilized Preoxygenation: Pre-oxygenation with 100% oxygen Induction Type: IV induction Ventilation: Mask ventilation without difficulty Laryngoscope Size: Mac and 3 Grade View: Grade I Tube type: Oral Tube size: 7.0 mm Number of attempts: 1 Airway Equipment and Method: Stylet Placement Confirmation: ETT inserted through vocal cords under direct vision,  positive ETCO2 and breath sounds checked- equal and bilateral Secured at: 21 cm Tube secured with: Tape Dental Injury: Teeth and Oropharynx as per pre-operative assessment

## 2020-10-19 NOTE — Transfer of Care (Signed)
Immediate Anesthesia Transfer of Care Note  Patient: Ashley [name]  Procedure(s) Performed: XI ROBOTIC ASSISTED LAPAROSCOPIC CHOLECYSTECTOMY (N/A )  Patient Location: PACU  Anesthesia Type:General  Level of Consciousness: awake, drowsy and patient cooperative  Airway & Oxygen Therapy: Patient Spontanous Breathing and Patient connected to face mask oxygen  Post-op Assessment: Report given to RN and Post -op Vital signs reviewed and stable  Post vital signs: Reviewed and stable  Last Vitals:  Vitals Value Taken Time  BP    Temp    Pulse 95 10/19/20 1320  Resp 16 10/19/20 1320  SpO2 100 % 10/19/20 1320  Vitals shown include unvalidated device data.  Last Pain:  Vitals:   10/19/20 0947  TempSrc: Oral  PainSc: 0-No pain      Patients Stated Pain Goal: 3 (74/08/14 4818)  Complications: No complications documented.

## 2020-10-19 NOTE — Anesthesia Postprocedure Evaluation (Signed)
Anesthesia Post Note  Patient: Ashley [name]  Procedure(s) Performed: XI ROBOTIC ASSISTED LAPAROSCOPIC CHOLECYSTECTOMY (N/A )     Patient location during evaluation: PACU Anesthesia Type: General Level of consciousness: awake and alert and oriented Pain management: pain level controlled Vital Signs Assessment: post-procedure vital signs reviewed and stable Respiratory status: spontaneous breathing, nonlabored ventilation and respiratory function stable Cardiovascular status: blood pressure returned to baseline Postop Assessment: no apparent nausea or vomiting Anesthetic complications: no   No complications documented.  Last Vitals:  Vitals:   10/19/20 1345 10/19/20 1400  BP: (!) 156/89 (!) 147/87  Pulse: 83 72  Resp: (!) 21 16  Temp:    SpO2: 100% 99%    Last Pain:  Vitals:   10/19/20 1400  TempSrc:   PainSc: [place]

## 2020-10-19 NOTE — Op Note (Signed)
Operative Note  Ashley [name]  [id number]  [id number]  10/19/2020   Surgeon: Waynard Reeds  Procedure performed: Xi robotic cholecystectomy  Preop diagnosis: biliary colic Post-op diagnosis/intraop findings: chronic cholecystitis  Specimens: gallbladder  Retained items: no EBL: minimal cc Complications: none  Description of procedure:   After obtaining informed consent the patient was brought to the operating room. Antibiotics were administered. SCD's were applied. General endotracheal anesthesia was initiated and a formal time-out was performed. The abdomen was prepped and draped in the usual sterile fashion and the abdomen was entered using a left upper quadrant veress needle after instilling the site with local. Insufflation to 44mmHg was obtained, periumbilical 60mm trocar and camera inserted, and gross inspection revealed no evidence of injury from our entry or other intraabdominal abnormalities.  Bilateral laparoscopic assisted taps blocks were performed with Exparel mixed with quarter percent Marcaine.  Three additional 8 mm robotictrocars were introduced in the right midclavicular and right anterior axillary lines under direct visualization and following infiltration with local.  The robot was then docked and instruments inserted under direct visualization.  Omental adhesions to the distended gallbladder were taken down bluntly and with cautery. The gallbladder was retracted cephalad and the infundibulum was retracted laterally. A combination of hook electrocautery and blunt dissection was utilized to clear the peritoneum from the neck and cystic duct, circumferentially isolating the cystic artery and cystic duct and lifting the gallbladder from the cystic plate. The critical view of safety was achieved with the cystic artery, cystic duct, and liver bed visualized between them with no other structures.  This was concordant with the view on firefly.  The artery was clipped with a  single clip proximally and distally and divided as was the cystic duct with three clips on the proximal end. The gallbladder was dissected from the liver plate using electrocautery.  Hemostasis along the liver bed was ensured with cautery as the dissection progressed. Once freed the gallbladder was placed in an endocatch bag and removed intact through the left upper quadrant trocar site, which did have to be extended slightly.  Hemostasis was once again confirmed, and reinspection of the abdomen revealed no injuries. The clips were well opposed without any bile leak from the duct or the liver bed on direct visualization nor on firefly. The left upper quadrant extraction port site in the was closed with a 0 vicryl in the fascia under direct visualization using a PMI device. The abdomen was desufflated and all trocars removed. The skin incisions were closed with subcuticular monocryl and Dermabond. The patient was awakened, extubated and transported to the recovery room in stable condition.   All counts were correct at the completion of the case.

## 2020-10-19 NOTE — Discharge Instructions (Signed)
LAPAROSCOPIC SURGERY: POST OP INSTRUCTIONS   EAT Gradually transition to a high fiber diet with a fiber supplement over the next few weeks after discharge.  Start with a pureed / full liquid diet (see below)  WALK Walk an hour a day.  Control your pain to do that.    CONTROL PAIN Control pain so that you can walk, sleep, tolerate sneezing/coughing, go up/down stairs.  HAVE A BOWEL MOVEMENT DAILY Keep your bowels regular to avoid problems.  OK to try a laxative to override constipation.  OK to use an antidairrheal to slow down diarrhea.  Call if not better after 2 tries  CALL IF YOU HAVE PROBLEMS/CONCERNS Call if you are still struggling despite following these instructions. Call if you have concerns not answered by these instructions    1. DIET: Follow a light bland diet & liquids the first 24 hours after arrival home, such as soup, liquids, starches, etc.  Be sure to drink plenty of fluids.  Quickly advance to a usual solid diet within a few days.  Avoid fast food or heavy meals as your are more likely to get nauseated or have irregular bowels.  A low-sugar, high-fiber diet for the rest of your life is ideal.  2. Take your usually prescribed home medications unless otherwise directed.  3. PAIN CONTROL: a. Pain is best controlled by a usual combination of three different methods TOGETHER: i. Ice/Heat ii. Over the counter pain medication iii. Prescription pain medication b. Most patients will experience some swelling and bruising around the incisions.  Ice packs or heating pads (30-60 minutes up to 6 times a day) will help. Use ice for the first few days to help decrease swelling and bruising, then switch to heat to help relax tight/sore spots and speed recovery.  Some people prefer to use ice alone, heat alone, alternating between ice & heat.  Experiment to what works for you.  Swelling and bruising can take several weeks to resolve.   c. It is helpful to take an over-the-counter pain  medication regularly for the first few days: i. Naproxen (Aleve, etc)  Two 220mg  tabs twice a day OR Ibuprofen (Advil, etc) Three 200mg  tabs four times a day (every meal & bedtime) AND ii. Acetaminophen (Tylenol, etc) 500-650mg  four times a day (every meal & bedtime) d. A  prescription for pain medication (such as oxycodone, hydrocodone, tramadol, gabapentin, methocarbamol, etc) should be given to you upon discharge.  Take your pain medication as prescribed, IF NEEDED.  i. If you are having problems/concerns with the prescription medicine (does not control pain, nausea, vomiting, rash, itching, etc), please call us [phone] to see if we need to switch you to a different pain medicine that will work better for you and/or control your side effect better. ii. If you need a refill on your pain medication, please give [place] 48 hour notice.  contact your pharmacy.  They will contact our office to request authorization. Prescriptions will not be filled after 5 pm or on week-ends  4. Avoid getting constipated.   a. Between the surgery and the pain medications, it is common to experience some constipation.   b. Increasing fluid intake and taking a fiber supplement (such as Metamucil, Citrucel, FiberCon, MiraLax, etc) 1-2 times a day regularly will usually help prevent this problem from occurring.   c. A mild laxative (prune juice, Milk of Magnesia, MiraLax, etc) should be taken according to package directions if there are no bowel movements after 48 hours.  5. Watch out for diarrhea.   a. If you have many loose bowel movements, simplify your diet to bland foods & liquids for a few days.   b. Stop any stool softeners and decrease your fiber supplement.   c. Switching to mild anti-diarrheal medications (Kayopectate, Pepto Bismol) can help.   d. If this worsens or does not improve, please call us.  6. Wash / shower every day.  You may shower over the skin glue which is waterproof  7. Glue will flake off  after about 2 weeks.  You may leave the incision open to air.  You may replace a dressing/Band-Aid to cover the incision for comfort if you wish.   8. ACTIVITIES as tolerated:   a. You may resume regular (light) daily activities beginning the next day--such as daily self-care, walking, climbing stairs--gradually increasing activities as tolerated.  If you can walk 30 minutes without difficulty, it is safe to try more intense activity such as jogging, treadmill, bicycling, low-impact aerobics, swimming, etc. b. Save the most intensive and strenuous activity for last such as sit-ups, heavy lifting, contact sports, etc  Refrain from any heavy lifting or straining until you are off narcotics for pain control.   c. DO NOT PUSH THROUGH PAIN.  Let pain be your guide: If it hurts to do something, don't do it.  Pain is your body warning you to avoid that activity for another week until the pain goes down. d. You may drive when you are no longer taking prescription pain medication, you can comfortably wear a seatbelt, and you can safely maneuver your car and apply brakes. e. You may have sexual intercourse when it is comfortable.  9. FOLLOW UP in our office a. Please call [facility] at [phone] to set up an appointment to see your surgeon in the office for a follow-up appointment approximately 2-3 weeks after your surgery. b. Make sure that you call for this appointment the day you arrive home to insure a convenient appointment time.  10. IF YOU HAVE DISABILITY OR FAMILY LEAVE FORMS, BRING THEM TO THE OFFICE FOR PROCESSING.  DO NOT GIVE THEM TO YOUR DOCTOR.   WHEN TO CALL US [phone]: 1. Poor pain control 2. Reactions / problems with new medications (rash/itching, nausea, etc)  3. Fever over 101.5 F (38.5 C) 4. Inability to urinate 5. Nausea and/or vomiting 6. Worsening swelling or bruising 7. Continued bleeding from incision. 8. Increased pain, redness, or drainage from the incision   The  clinic staff is available to answer your questions during regular business hours (8:30am-5pm).  Please don't hesitate to call and ask to speak to one of our nurses for clinical concerns.   If you have a medical emergency, go to the nearest emergency room or call 911.  A surgeon from [facility] is always on call at the hospitals   [facility] [address], [address], [place], [place]  [address] ? MAIN: [phone] ? TOLL FREE: [phone] ?  FAX [phone] [email or web address]   

## 2020-10-19 NOTE — Interval H&P Note (Signed)
History and Physical Interval Note:  10/19/2020 10:43 AM  Ashley [name]  has presented today for surgery, with the diagnosis of BILIARY COLIC.  The various methods of treatment have been discussed with the patient and family. After consideration of risks, benefits and other options for treatment, the patient has consented to  Procedure(s) with comments: XI ROBOTIC ASSISTED LAPAROSCOPIC CHOLECYSTECTOMY (N/A) - ROOM 3 STARTING AT 11:45AM FOR 90 MIN as a surgical intervention.  The patient's history has been reviewed, patient examined, no change in status, stable for surgery.  I have reviewed the patient's chart and labs.  Questions were answered to the patient's satisfaction.     Vernice Bowker Rich Brave

## 2020-10-20 LAB — SURGICAL PATHOLOGY

## 2020-11-01 ENCOUNTER — Ambulatory Visit
Admission: RE | Admit: 2020-11-01 | Discharge: 2020-11-01 | Disposition: A | Discharge status: Home / Self Care | Payer: BC Managed Care – PPO | Source: Ambulatory Visit | Attending: Obstetrics and Gynecology | Admitting: Obstetrics and Gynecology

## 2020-11-01 DIAGNOSIS — N83201 Unspecified ovarian cyst, right side: Secondary | ICD-10-CM

## 2022-01-16 IMAGING — US US ABDOMEN LIMITED
1 series · 14 of 25 positions shown · non-contrast
Comparison: None.

CLINICAL DATA: Right upper quadrant abdominal pain

EXAM:
ULTRASOUND ABDOMEN LIMITED RIGHT UPPER QUADRANT

[Series 1: us abdomen limited · 14 of 48 slices shown]
[im 1/48]
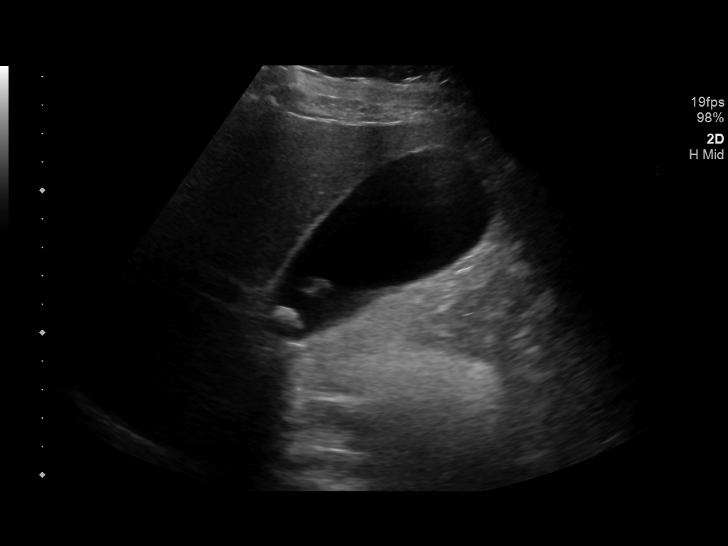
[im 4/48]
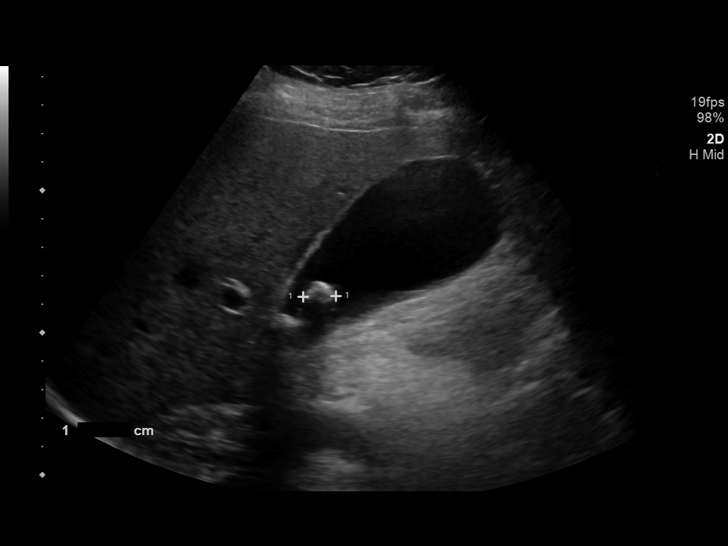
[im 8/48]
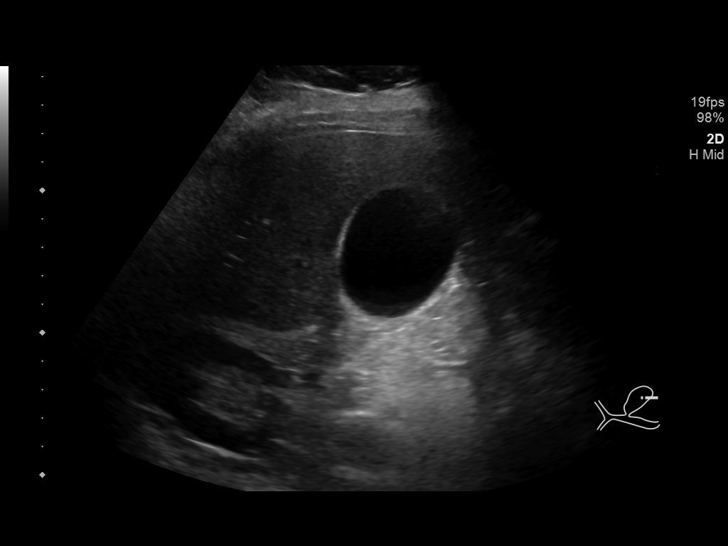
[im 12/48]
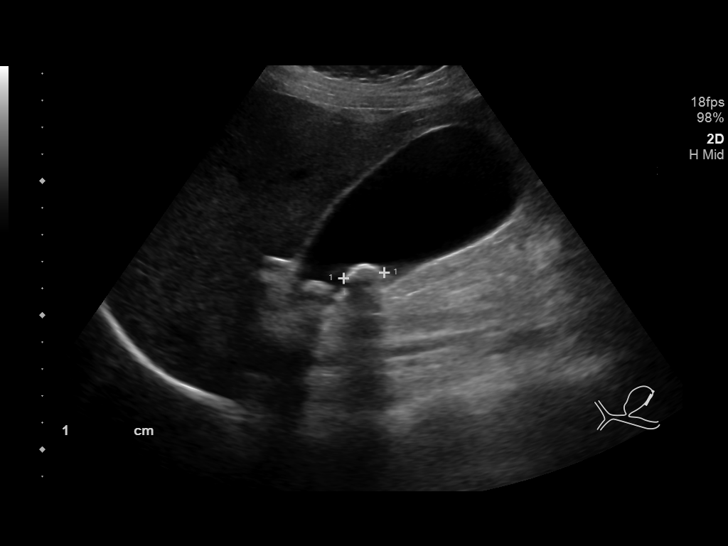
[im 16/48]
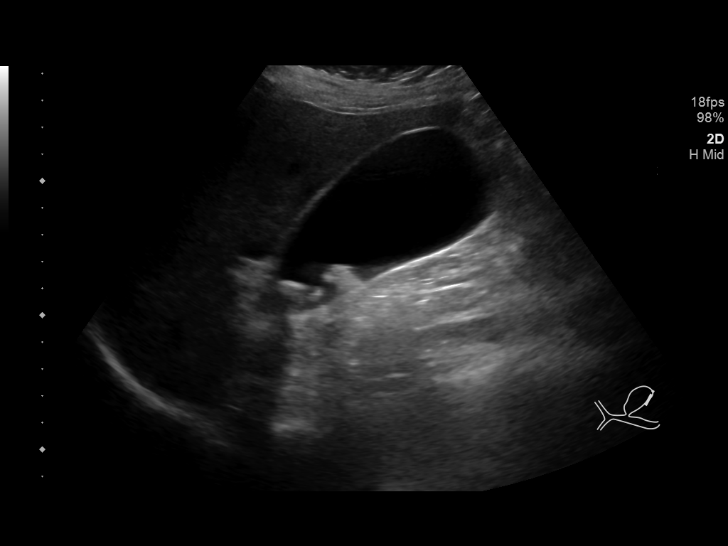
[im 18/48]
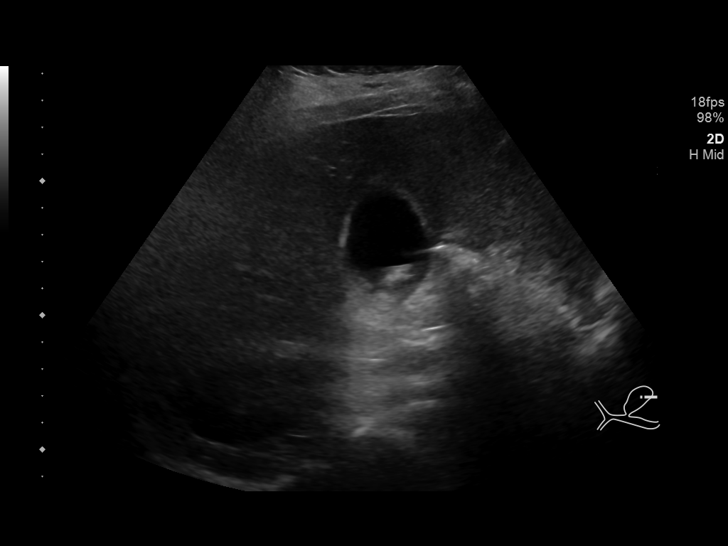
[im 22/48]
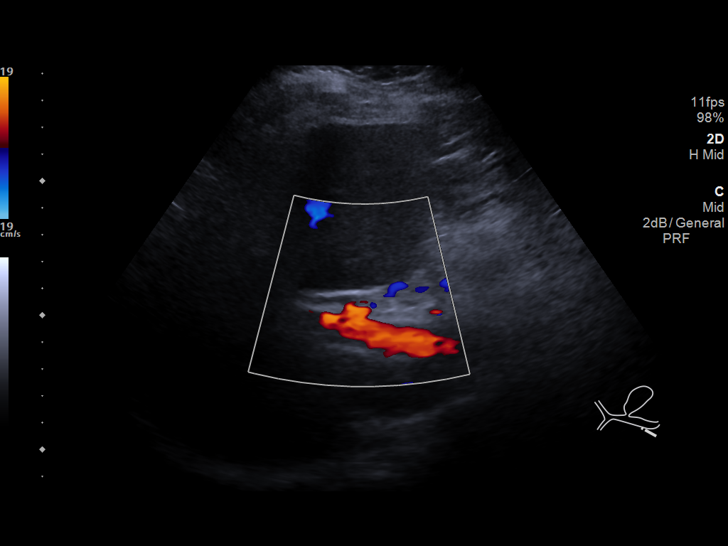
[im 26/48]
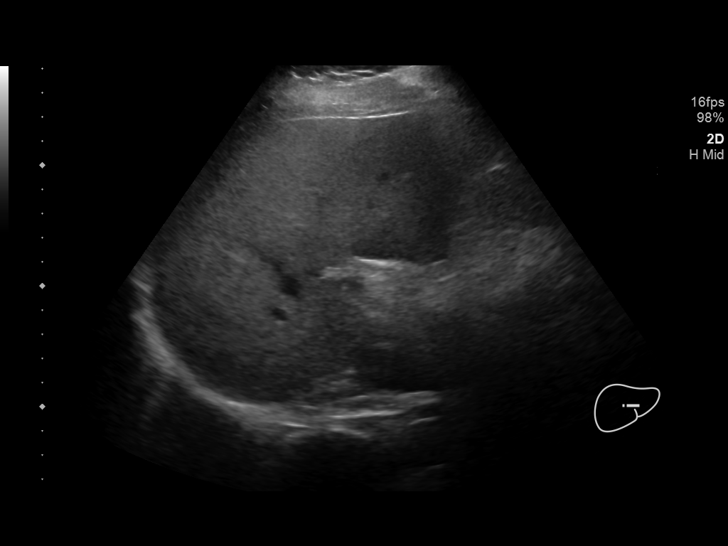
[im 30/48]
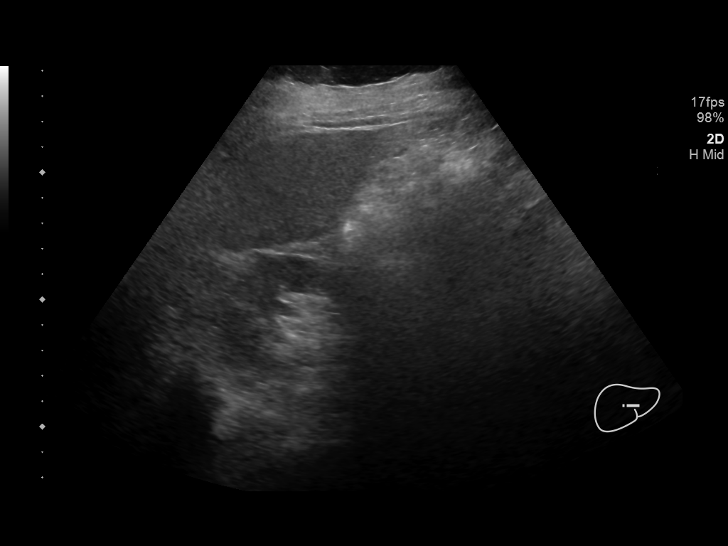
[im 32/48]
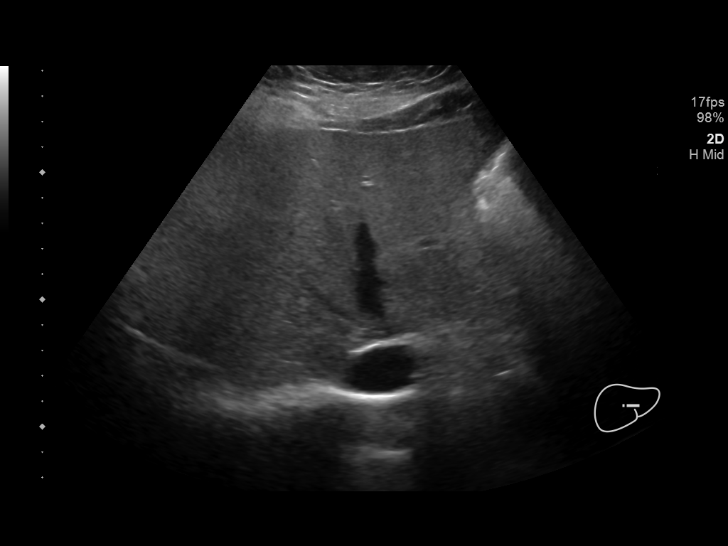
[im 36/48]
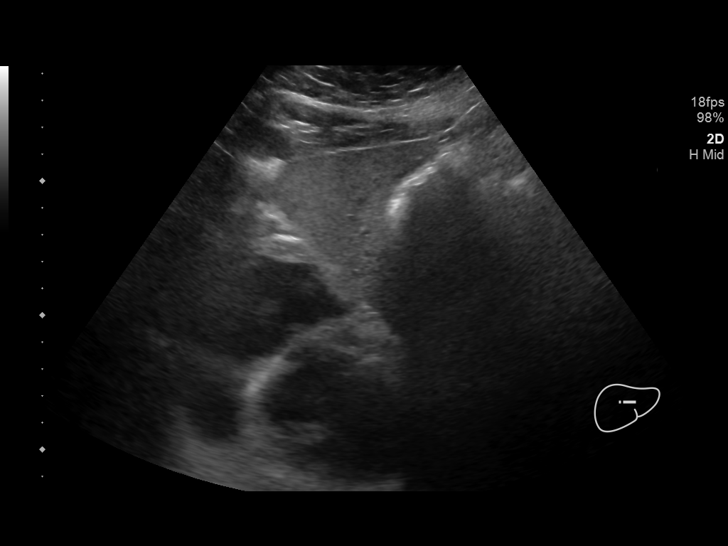
[im 40/48]
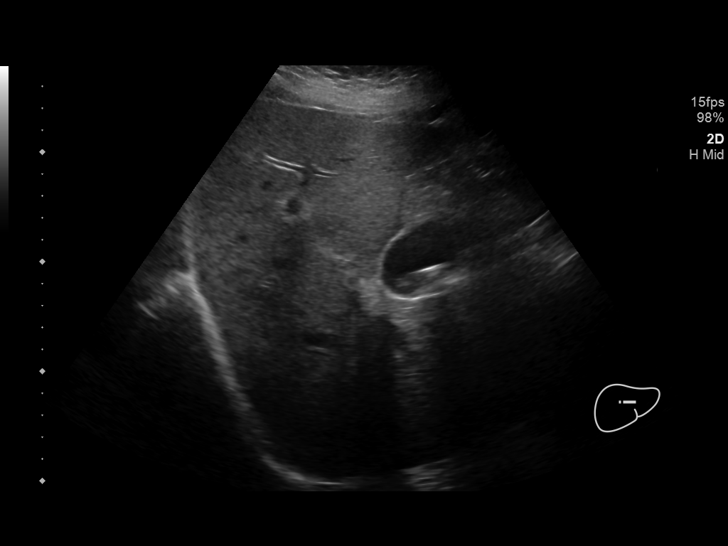
[im 44/48]
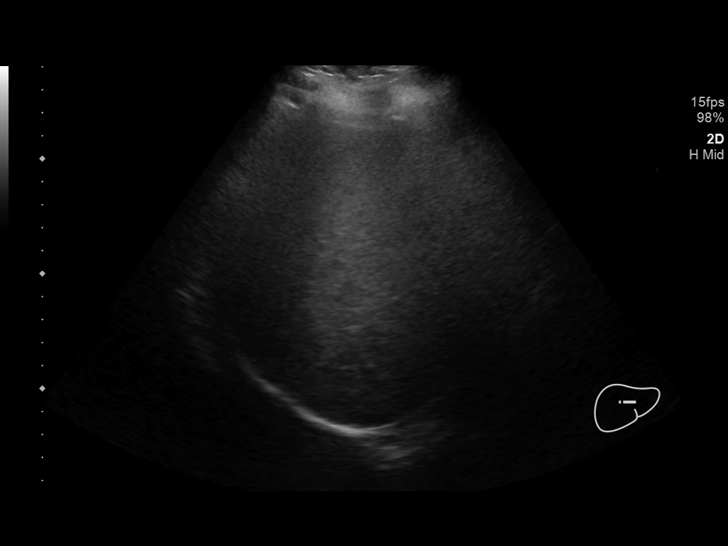
[im 48/48]
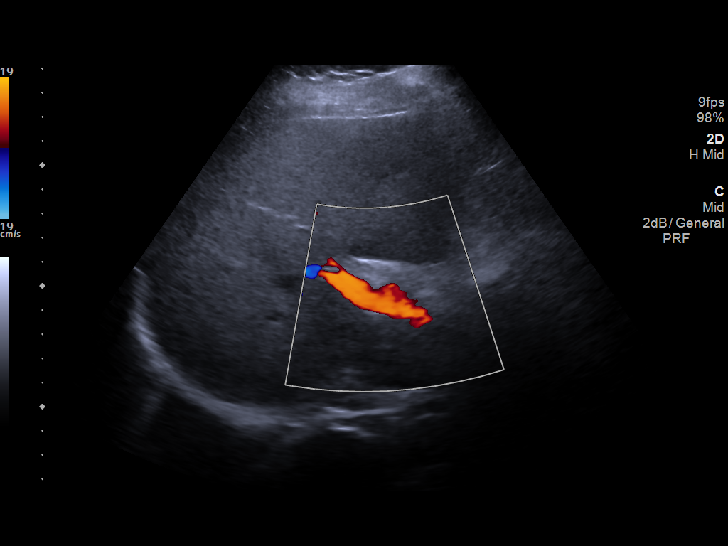

[14 of 25 positions shown; findings below may reference images not displayed]

FINDINGS: Gallbladder:

There is cholelithiasis. There is no gallbladder wall thickening or
pericholecystic free fluid. The sonographic Murphy sign is negative.

Common bile duct:

Diameter: 3 mm

Liver:

Diffuse increased echogenicity with slightly heterogeneous liver.
Appearance typically secondary to fatty infiltration. Fibrosis
secondary consideration. No secondary findings of cirrhosis noted.
No focal hepatic lesion or intrahepatic biliary duct dilatation.
Portal vein is patent on color Doppler imaging with normal direction
of blood flow towards the liver.

Other: None.
IMPRESSION: There is cholelithiasis without secondary signs of acute
cholecystitis.

Hepatic steatosis

## 2022-02-09 IMAGING — US US PELVIS COMPLETE WITH TRANSVAGINAL
1 series · 13 of 25 positions shown · non-contrast
Comparison: None available.

CLINICAL DATA: Initial evaluation for right lower quadrant pain,
history of fibroids.



[Series 1: us pelvis complete with transvaginal · 0.25mm/px · 68 acquisitions, 13 frames shown]
[im 1/68]
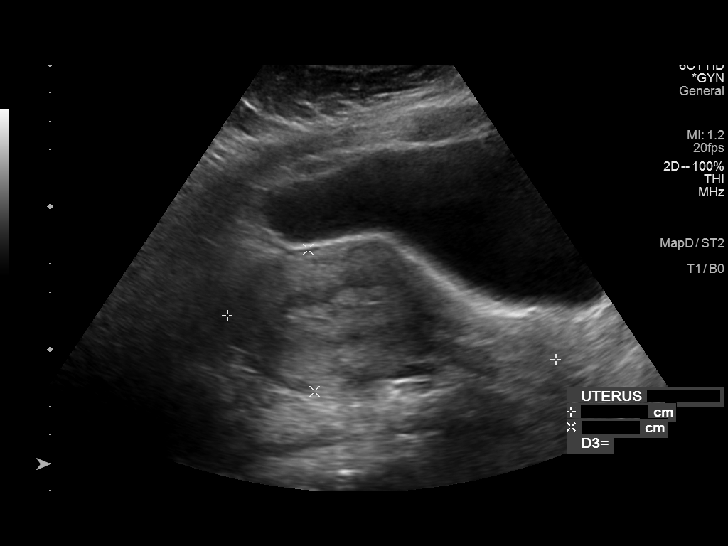
[im 6/68]
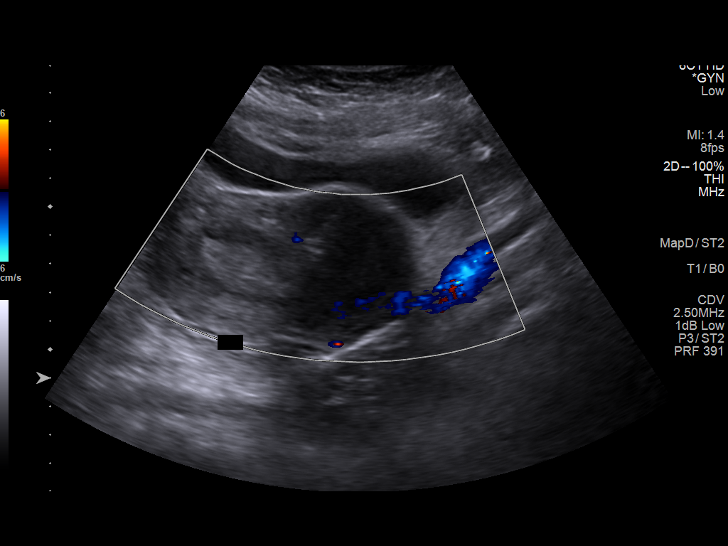
[im 12/68]
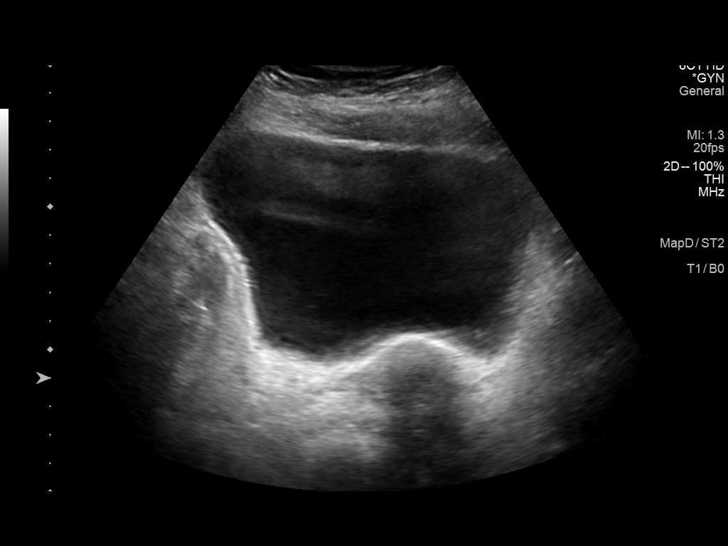
[im 17/68]
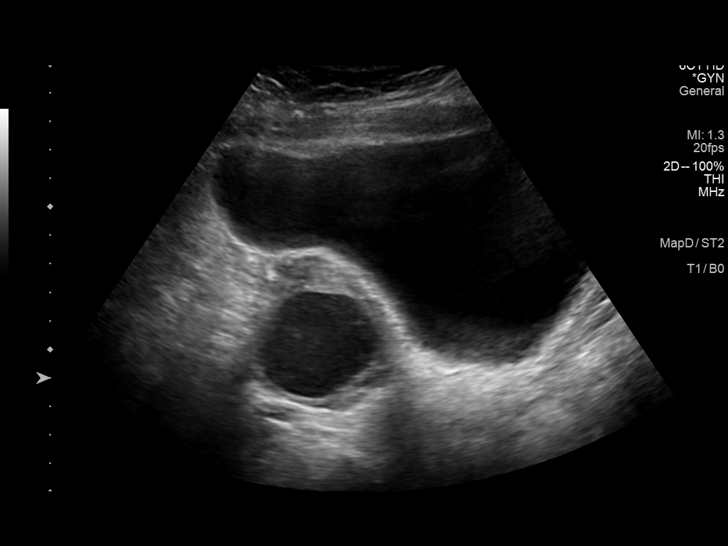
[im 23/68]
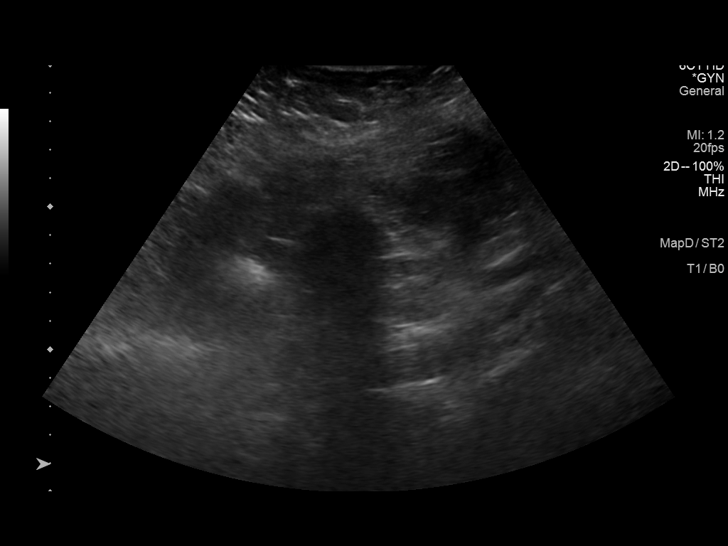
[im 28/68]
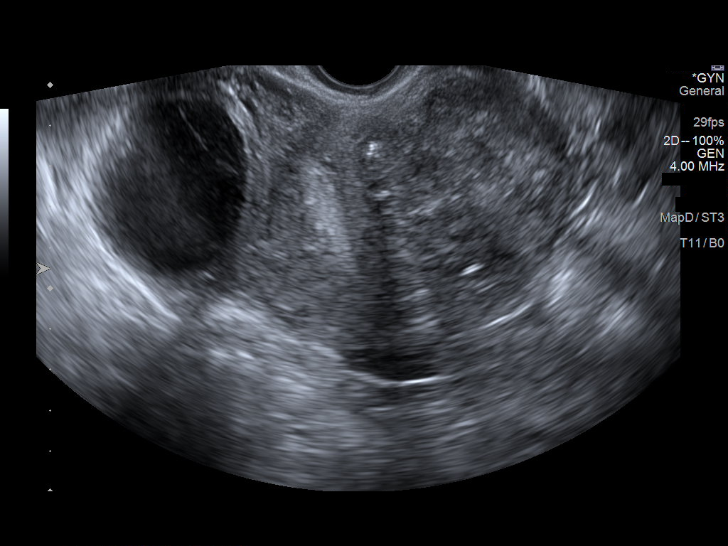
[im 34/68]
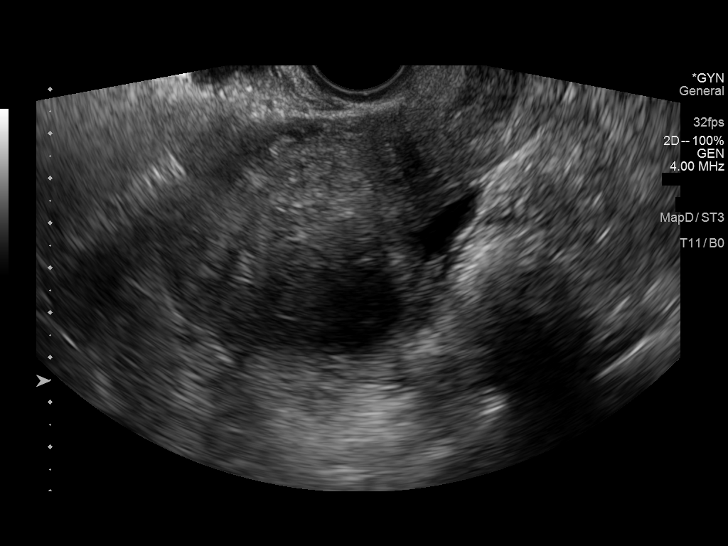
[im 40/68]
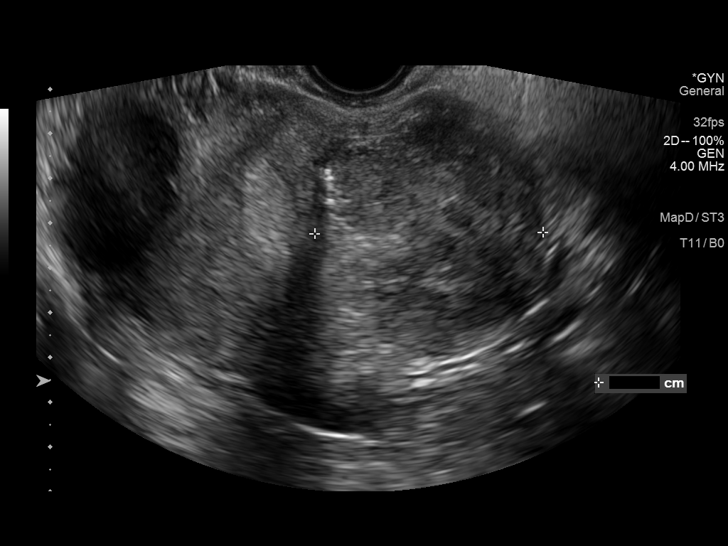
[im 45/68]
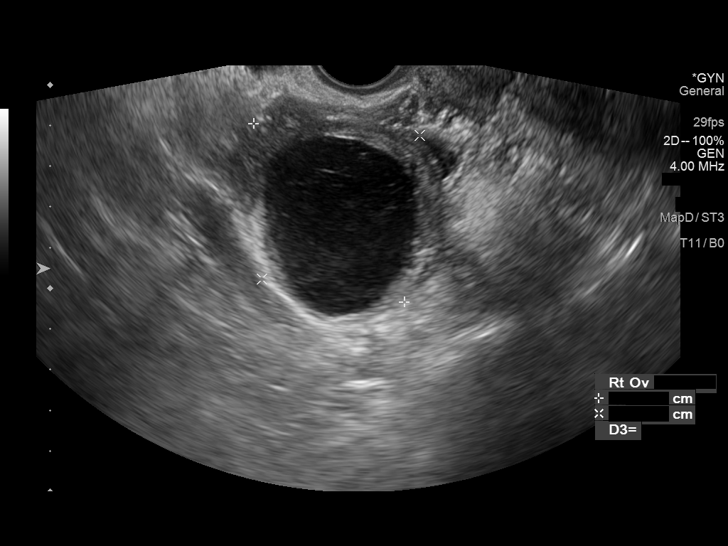
[im 51/68]
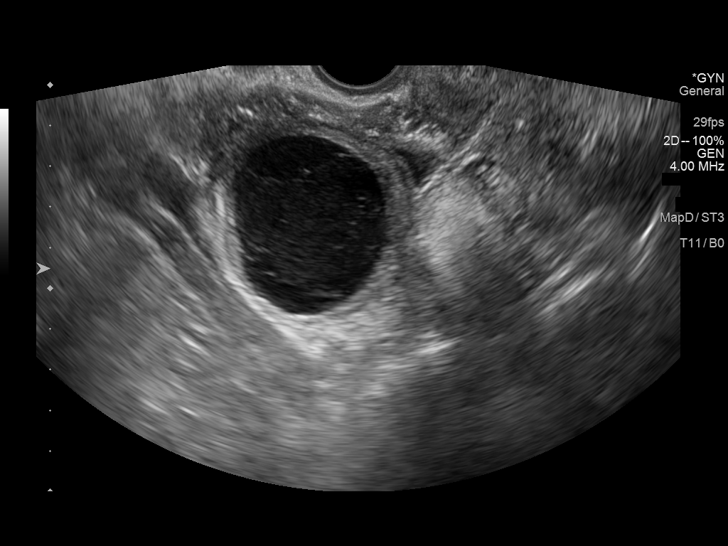
[im 56/68]
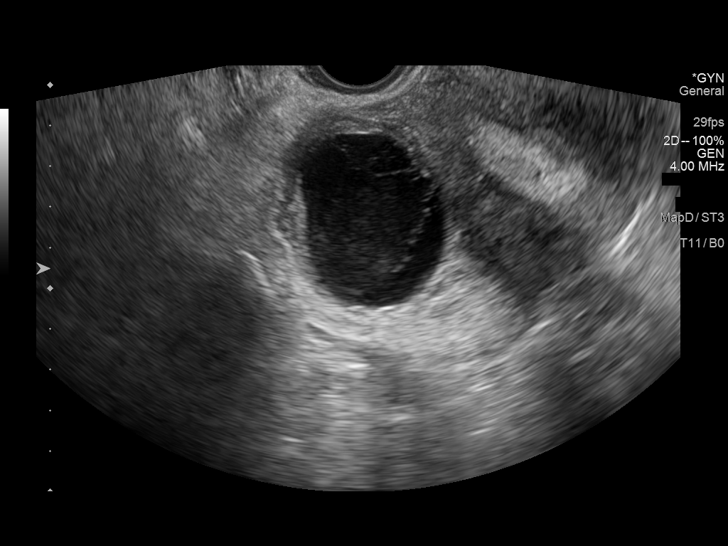
[im 62/68]
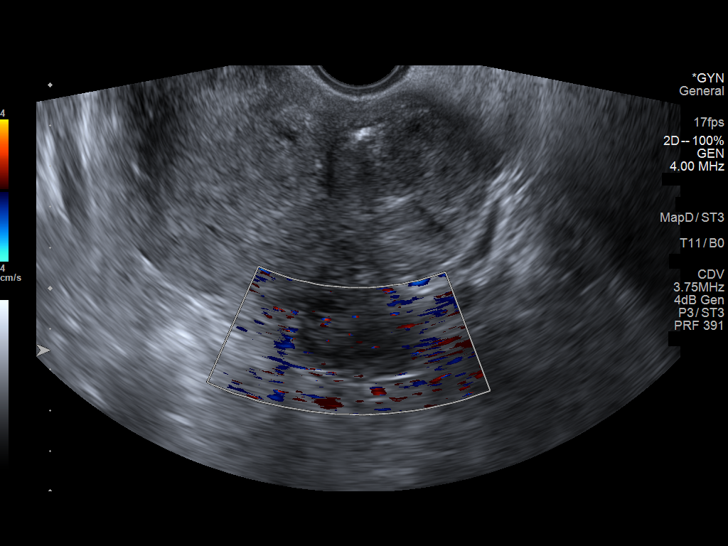
[im 68/68]
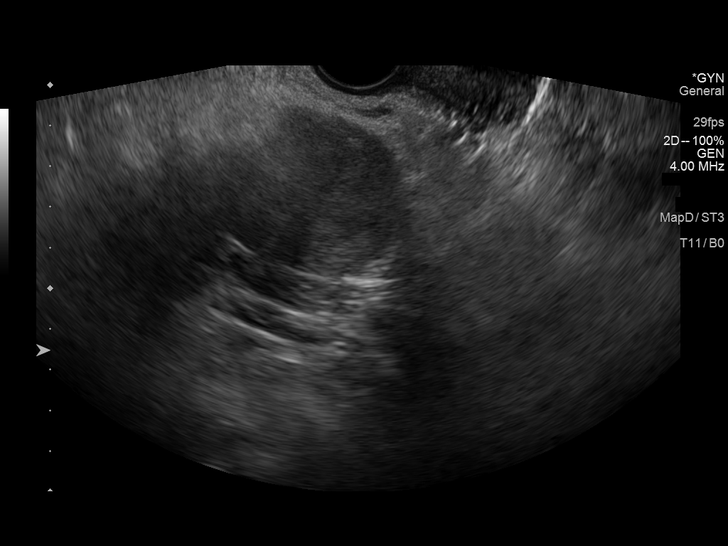

[13 of 25 positions shown; findings below may reference images not displayed]

FINDINGS: Uterus

Measurements: 11.6 x 5.0 x 10.3 cm = volume: 313.4 mL. Uterus is
anteverted. 5.6 x 5.9 x 5.2 cm fibroid seen positioned at the left
anterior uterine fundus.

Endometrium

Thickness: 17.4 mm.  No focal abnormality visualized.

Right ovary

Measurements: 5.7 x 5.2 x 4.4 cm = volume: 69.0 mL. 4.4 x 3.8 x
cm complex right ovarian cyst. Cyst demonstrates internal lace-like
architecture without vascularity or solid nodularity, consistent
with a hemorrhagic cyst.

Left ovary

Measurements: 3.3 x 1.4 x 2.6 cm = volume: 6.3 mL. Normal
appearance/no adnexal mass.

Other findings

Trace free fluid seen within the pelvis.
IMPRESSION: 1. 4.4 cm complex right ovarian cyst, most consistent with a
hemorrhagic cyst. While this is almost certainly benign, a short
interval follow-up ultrasound in 6-12 weeks to ensure resolution
could be performed as clinically warranted.
2. 5.9 cm fibroid at the left anterior uterine fundus.
3. Otherwise unremarkable and normal pelvic ultrasound.

## 2022-04-07 ENCOUNTER — Other Ambulatory Visit: Payer: Self-pay | Admitting: Obstetrics and Gynecology

## 2022-04-07 DIAGNOSIS — R928 Other abnormal and inconclusive findings on diagnostic imaging of breast: Secondary | ICD-10-CM

## 2022-04-21 ENCOUNTER — Ambulatory Visit
Admission: RE | Admit: 2022-04-21 | Discharge: 2022-04-21 | Disposition: A | Discharge status: Home / Self Care | Payer: BC Managed Care – PPO | Source: Ambulatory Visit | Attending: Obstetrics and Gynecology | Admitting: Obstetrics and Gynecology

## 2022-04-21 DIAGNOSIS — R928 Other abnormal and inconclusive findings on diagnostic imaging of breast: Secondary | ICD-10-CM
